# Patient Record
Sex: Male | Born: 1960 | Race: Asian | Hispanic: No | Marital: Married | Smoking: Never smoker
Health system: Southern US, Community
[De-identification: ages and names within clinical notes are randomized; demographics above are authoritative.]

## PROBLEM LIST (undated history)

## (undated) DIAGNOSIS — I10 Essential (primary) hypertension: Secondary | ICD-10-CM

## (undated) DIAGNOSIS — E785 Hyperlipidemia, unspecified: Secondary | ICD-10-CM

## (undated) DIAGNOSIS — T7840XA Allergy, unspecified, initial encounter: Secondary | ICD-10-CM

## (undated) DIAGNOSIS — G709 Myoneural disorder, unspecified: Secondary | ICD-10-CM

## (undated) HISTORY — DX: Allergy, unspecified, initial encounter: T78.40XA

## (undated) HISTORY — DX: Myoneural disorder, unspecified: G70.9

## (undated) HISTORY — DX: Hyperlipidemia, unspecified: E78.5

---

## 2013-01-13 ENCOUNTER — Ambulatory Visit (INDEPENDENT_AMBULATORY_CARE_PROVIDER_SITE_OTHER): Payer: BC Managed Care – PPO | Admitting: Family Medicine

## 2013-01-13 ENCOUNTER — Ambulatory Visit: Payer: BC Managed Care – PPO

## 2013-01-13 VITALS — BP 121/80 | HR 64 | Temp 98.1°F | Resp 16 | Ht 66.5 in | Wt 160.0 lb

## 2013-01-13 DIAGNOSIS — M79605 Pain in left leg: Secondary | ICD-10-CM

## 2013-01-13 DIAGNOSIS — Z609 Problem related to social environment, unspecified: Secondary | ICD-10-CM

## 2013-01-13 DIAGNOSIS — R059 Cough, unspecified: Secondary | ICD-10-CM

## 2013-01-13 DIAGNOSIS — Z789 Other specified health status: Secondary | ICD-10-CM

## 2013-01-13 DIAGNOSIS — M79604 Pain in right leg: Secondary | ICD-10-CM

## 2013-01-13 DIAGNOSIS — M79609 Pain in unspecified limb: Secondary | ICD-10-CM

## 2013-01-13 DIAGNOSIS — R05 Cough: Secondary | ICD-10-CM

## 2013-01-13 LAB — COMPREHENSIVE METABOLIC PANEL
ALT: 21 U/L (ref 0–53)
AST: 22 U/L (ref 0–37)
Alkaline Phosphatase: 64 U/L (ref 39–117)
BUN: 13 mg/dL (ref 6–23)
Calcium: 9.6 mg/dL (ref 8.4–10.5)
Creat: 1.08 mg/dL (ref 0.50–1.35)
Total Bilirubin: 0.4 mg/dL (ref 0.3–1.2)

## 2013-01-13 MED ORDER — FLUTICASONE PROPIONATE 50 MCG/ACT NA SUSP: 2.0000 | Freq: Every day | NASAL | Status: DC

## 2013-01-13 MED ORDER — MELOXICAM 7.5 MG PO TABS: 7.5000 mg | ORAL_TABLET | Freq: Every day | ORAL | Status: DC

## 2013-01-13 MED ORDER — BENZONATATE 100 MG PO CAPS: ORAL_CAPSULE | ORAL | Status: AC

## 2013-01-13 NOTE — Progress Notes (Signed)
[address], [place] [address]   Phone [phone]  Subjective:    Patient ID: Nicolas Thompson, male    DOB: June 08, 1961, 52 y.o.   MRN: [id number]  HPI Pt presents to clinic with 2 problems and daughter interpreting. 1- cough for the last 3-4 days with some throat and eye itching.  No congestion with rhinorrhea or productive cough.  He has been in the [place] from [place] for years and he has never had this problem in the past.   The cough is waking him up at night at times. 2- bilaterally leg pain that starts in his buttocks and goes through his calves.  I cannot determine whether the pain is sharp and shooting or aching.  He states it does not come from his back.  It is worse with bending and walking.  Once he stops walking the pain resolves.  The pain is the same in both legs.  He has no paresthesias or numbness. He has not had an injury to his back or legs that he knows of.  He works [employer] inside.  He last traveled to [place] 2 years ago.   OTC meds - benadryl and Nyquil not helping the cough, not taking any medications for the leg pain  Review of Systems  Constitutional: Negative for fever and chills.  HENT: Positive for sore throat (itching). Negative for congestion and rhinorrhea.   Respiratory: Positive for cough (dry cough).   Musculoskeletal: Positive for myalgias (back of legs - increases with activity). Negative for back pain, arthralgias and gait problem.  Allergic/Immunologic: Negative for environmental allergies.  Psychiatric/Behavioral: Positive for sleep disturbance (2nd to cough).       Objective:   Physical Exam  Vitals reviewed. Constitutional: He is oriented to person, place, and time. He appears well-developed and well-nourished.  Pt never makes eye contact or looks at me during my time in the room.  HENT:  Head: Normocephalic and atraumatic.  Right Ear: Hearing, tympanic membrane, external ear and ear canal normal.  Left Ear: Hearing, tympanic membrane,  external ear and ear canal normal.  Nose: Mucosal edema (pale and swollen) present. No rhinorrhea.  Mouth/Throat: Uvula is midline, oropharynx is clear and moist and mucous membranes are normal.  Eyes: Conjunctivae are normal.  Cardiovascular: Normal rate, regular rhythm and normal heart sounds.   No murmur heard. Pulmonary/Chest: Effort normal and breath sounds normal.  Musculoskeletal: Normal range of motion.       Lumbar back: Normal.       Right upper leg: Normal.       Left upper leg: Normal.       Right lower leg: Normal.       Left lower leg: Normal.  No TTP of posterior legs.  No palpable cords in the calves.  No erythema.  Cap refill of bilateral feet is 3-4 secs.  Feet are cold, DP pulses are good and equal bilaterally.  Pt has neg SLR.  He has no pain with resistance testing of leg muscle groups.  Neurological: He is alert and oriented to person, place, and time. He has normal strength and normal reflexes. He displays no atrophy. No cranial nerve deficit or sensory deficit. He exhibits normal muscle tone.  Reflex Scores:      Patellar reflexes are 2+ on the right side and 2+ on the left side.      Achilles reflexes are 2+ on the right side and 2+ on the left side. Skin: Skin is warm and  dry.   [facility] reading (PRIMARY) by  Dr. Katrinka Blazing.  Normal L spine.  Significant stool.     Assessment & Plan:  Leg pain, bilateral - Plan: DG Lumbar Spine 2-3 Views, Comprehensive metabolic panel, meloxicam (MOBIC) 7.5 MG tablet - unsure exactly what the patient is experiencing.  I believe this is muscle related and maybe from tight muscles and standing all day and aching as a result of a hard floor.  His xrays look normal and he has a normal NMV exam but spinal stenosis is still part of the differentiate.  ? Possible claudication due to the fact that the pain is worse with movement/walking and resolves with rest.  We will try a NSAID and if no better have pt RTC for recheck and refer if needed at that  time.  Cough - Plan: fluticasone (FLONASE) 50 MCG/ACT nasal spray, benzonatate (TESSALON) 100 MG capsule - I think that the patient's cough is related to allergies - we will treat the allergies and cough at the same time.  Language barrier - history was very difficult to get - daughter had a hard time explaining what he was experiencing.    Benny Lennert PA-C 01/13/2013 5:18 PM

## 2013-01-14 ENCOUNTER — Encounter: Payer: Self-pay | Admitting: *Deleted

## 2013-01-16 NOTE — Progress Notes (Signed)
Reviewed history and physical exam in detail with Benny Lennert, PA-C.  Agree with assessment and plan.

## 2015-01-14 ENCOUNTER — Ambulatory Visit (INDEPENDENT_AMBULATORY_CARE_PROVIDER_SITE_OTHER): Payer: 59

## 2015-01-14 ENCOUNTER — Ambulatory Visit (INDEPENDENT_AMBULATORY_CARE_PROVIDER_SITE_OTHER): Payer: 59 | Admitting: Emergency Medicine

## 2015-01-14 VITALS — BP 130/80 | HR 66 | Temp 98.0°F | Ht 66.5 in | Wt 164.1 lb

## 2015-01-14 DIAGNOSIS — J40 Bronchitis, not specified as acute or chronic: Secondary | ICD-10-CM | POA: Diagnosis not present

## 2015-01-14 DIAGNOSIS — J302 Other seasonal allergic rhinitis: Secondary | ICD-10-CM

## 2015-01-14 DIAGNOSIS — J209 Acute bronchitis, unspecified: Secondary | ICD-10-CM

## 2015-01-14 DIAGNOSIS — R05 Cough: Secondary | ICD-10-CM | POA: Diagnosis not present

## 2015-01-14 DIAGNOSIS — R059 Cough, unspecified: Secondary | ICD-10-CM

## 2015-01-14 MED ORDER — LEVOFLOXACIN 500 MG PO TABS: 500.0000 mg | ORAL_TABLET | Freq: Every day | ORAL | Status: AC

## 2015-01-14 MED ORDER — ALBUTEROL SULFATE HFA 108 (90 BASE) MCG/ACT IN AERS: 2.0000 | INHALATION_SPRAY | RESPIRATORY_TRACT | Status: DC | PRN

## 2015-01-14 MED ORDER — FEXOFENADINE-PSEUDOEPHED ER 180-240 MG PO TB24: 1.0000 | ORAL_TABLET | Freq: Every day | ORAL | Status: DC

## 2015-01-14 MED ORDER — HYDROCOD POLST-CPM POLST ER 10-8 MG/5ML PO SUER: 5.0000 mL | Freq: Two times a day (BID) | ORAL | Status: DC

## 2015-01-14 NOTE — Progress Notes (Signed)
[facility] [address], [place] [id number] [phone]  Date:  01/14/2015   Name:  Nicolas SextonCong Febres   DOB:  08/09/1961   MRN:  [id number]  PCP:  No PCP Per Patient    Chief Complaint: Allergic Rhinitis  and Cough   History of Present Illness:  Nicolas SextonCong Meder is a 54 y.o. very pleasant male patient who presents with the following:  Returned from [place] a month ago and has allergic symptoms since Has a non productive cough with no wheezing or shortness of breath Has itchy watery eyes, swollen lids. Has watery nasal drainage. Was put on antibiotic ()and given a steroid shot with no improvement at another office. Has a fever no chills Cough for two weeks. No nausea or vomiting. No stool change No rash No improvement with over the counter medications or other home remedies.  Denies other complaint or health concern today.   There are no active problems to display for this patient.   Past Medical History  Diagnosis Date  . Neuromuscular disorder     No past surgical history on file.  History  Substance Use Topics  . Smoking status: Never Smoker   . Smokeless tobacco: Not on file  . Alcohol Use: No    No family history on file.  No Known Allergies  Medication list has been reviewed and updated.  No current outpatient prescriptions on file prior to visit.   No current facility-administered medications on file prior to visit.    Review of Systems:  Review of Systems  Constitutional: Negative for fever, chills and fatigue.  HENT: Negative for congestion, ear pain, hearing loss, postnasal drip, rhinorrhea and sinus pressure.   Eyes: Negative for discharge and redness.  Respiratory: Negative for shortness of breath and wheezing.   Cardiovascular: Negative for chest pain and leg swelling.  Gastrointestinal: Negative for nausea, vomiting, abdominal pain, constipation and blood in stool.  Genitourinary: Negative for dysuria, urgency and frequency.   Musculoskeletal: Negative for neck stiffness.  Skin: Negative for rash.  Neurological: Negative for seizures, weakness and headaches.     Physical Examination: Filed Vitals:   01/14/15 1617  BP: 130/80  Pulse: 66  Temp: 98 F (36.7 C)   Filed Vitals:   01/14/15 1617  Height: 5' 6.5" (1.689 m)  Weight: 164 lb 2 oz (74.447 kg)   Body mass index is 26.1 kg/(m^2). Ideal Body Weight: Weight in (lb) to have BMI = 25: 156.9  GEN: WDWN, NAD, Non-toxic, A & O x 3  Persistent cough HEENT: Atraumatic, Normocephalic. Neck supple. No masses, No LAD. Ears and Nose: No external deformity. CV: RRR, No M/G/R. No JVD. No thrill. No extra heart sounds. PULM: CTA B, no wheezes, crackles, rhonchi. No retractions. No resp. distress. No accessory muscle use. ABD: S, NT, ND, +BS. No rebound. No HSM. EXTR: No c/c/e NEURO Normal gait.  PSYCH: Normally interactive. Conversant. Not depressed or anxious appearing.  Calm demeanor.    Assessment and Plan: Bronchitis bronchospasm Albuterol SAR Allegra d   Signed Phillips OdorJeffery Jubal Rademaker, MD   [facility] reading (PRIMARY) by  Dr. Dareen PianoAnderson. Negative.

## 2015-01-14 NOTE — Patient Instructions (Signed)
Metered Dose Inhaler (No Spacer Used)  Inhaled medicines are the basis of treatment for asthma and other breathing problems. Inhaled medicine can only be effective if used properly. Good technique assures that the medicine reaches the lungs.  Metered dose inhalers (MDIs) are used to deliver a variety of inhaled medicines. These include quick relief or rescue medicines (such as bronchodilators) and controller medicines (such as corticosteroids). The medicine is delivered by pushing down on a metal canister to release a set amount of spray.  If you are using different kinds of inhalers, use your quick relief medicine to open the airways 10-15 minutes before using a steroid, if instructed to do so by your health care provider. If you are unsure which inhalers to use and the order of using them, ask your health care provider, nurse, or respiratory therapist.  HOW TO USE THE INHALER  1. Remove the cap from the inhaler.  2. If you are using the inhaler for the first time, you will need to prime it. Shake the inhaler for 5 seconds and release four puffs into the air, away from your face. Ask your health care provider or pharmacist if you have questions about priming your inhaler.  3. Shake the inhaler for 5 seconds before each breath in (inhalation).  4. Position the inhaler so that the top of the canister faces up.  5. Put your index finger on the top of the medicine canister. Your thumb supports the bottom of the inhaler.  6. Open your mouth.  7. Either place the inhaler between your teeth and place your lips tightly around the mouthpiece, or hold the inhaler 1-2 inches away from your open mouth. If you are unsure of which technique to use, ask your health care provider.  8. Breathe out (exhale) normally and as completely as possible.  9. Press the canister down with the index finger to release the medicine.  10. At the same time as the canister is pressed, inhale deeply and slowly until your lungs are completely filled.  This should take 4-6 seconds. Keep your tongue down.  11. Hold the medicine in your lungs for 5-10 seconds (10 seconds is best). This helps the medicine get into the small airways of your lungs.  12. Breathe out slowly, through pursed lips. Whistling is an example of pursed lips.  13. Wait at least 1 minute between puffs. Continue with the above steps until you have taken the number of puffs your health care provider has ordered. Do not use the inhaler more than your health care provider directs you to.  14. Replace the cap on the inhaler.  15. Follow the directions from your health care provider or the inhaler insert for cleaning the inhaler.  If you are using a steroid inhaler, after your last puff, rinse your mouth with water, gargle, and spit out the water. Do not swallow the water.  AVOID:  · Inhaling before or after starting the spray of medicine. It takes practice to coordinate your breathing with triggering the spray.  · Inhaling through the nose (rather than the mouth) when triggering the spray.  HOW TO DETERMINE IF YOUR INHALER IS FULL OR NEARLY EMPTY  You cannot know when an inhaler is empty by shaking it. Some inhalers are now being made with dose counters. Ask your health care provider for a prescription that has a dose counter if you feel you need that extra help. If your inhaler does not have a counter, ask your health care   provider to help you determine the date you need to refill your inhaler. Write the refill date on a calendar or your inhaler canister. Refill your inhaler 7-10 days before it runs out. Be sure to keep an adequate supply of medicine. This includes making sure it has not expired, and making sure you have a spare inhaler.  SEEK MEDICAL CARE IF:  · Symptoms are only partially relieved with your inhaler.  · You are having trouble using your inhaler.  · You experience an increase in phlegm.  SEEK IMMEDIATE MEDICAL CARE IF:  · You feel little or no relief with your inhalers. You are still  wheezing and feeling shortness of breath, tightness in your chest, or both.  · You have dizziness, headaches, or a fast heart rate.  · You have chills, fever, or night sweats.  · There is a noticeable increase in phlegm production, or there is blood in the phlegm.  MAKE SURE YOU:  · Understand these instructions.  · Will watch your condition.  · Will get help right away if you are not doing well or get worse.  Document Released: 06/29/2007 Document Revised: 01/16/2014 Document Reviewed: 02/17/2013  [employer]® Patient Information ©2015 [employer]. This information is not intended to replace advice given to you by your health care provider. Make sure you discuss any questions you have with your health care provider.

## 2015-02-07 ENCOUNTER — Ambulatory Visit (INDEPENDENT_AMBULATORY_CARE_PROVIDER_SITE_OTHER): Payer: 59

## 2015-02-07 ENCOUNTER — Ambulatory Visit (INDEPENDENT_AMBULATORY_CARE_PROVIDER_SITE_OTHER): Payer: 59 | Admitting: Family Medicine

## 2015-02-07 VITALS — BP 122/80 | HR 66 | Temp 98.8°F | Resp 18 | Ht 66.5 in | Wt 162.6 lb

## 2015-02-07 DIAGNOSIS — R1013 Epigastric pain: Secondary | ICD-10-CM

## 2015-02-07 DIAGNOSIS — R05 Cough: Secondary | ICD-10-CM

## 2015-02-07 DIAGNOSIS — K219 Gastro-esophageal reflux disease without esophagitis: Secondary | ICD-10-CM

## 2015-02-07 DIAGNOSIS — E860 Dehydration: Secondary | ICD-10-CM

## 2015-02-07 DIAGNOSIS — R059 Cough, unspecified: Secondary | ICD-10-CM

## 2015-02-07 DIAGNOSIS — R630 Anorexia: Secondary | ICD-10-CM

## 2015-02-07 DIAGNOSIS — J302 Other seasonal allergic rhinitis: Secondary | ICD-10-CM

## 2015-02-07 LAB — POCT CBC
Granulocyte percent: 60.5 % (ref 37–80)
HCT, POC: 47.1 % (ref 43.5–53.7)
Hemoglobin: 16.1 g/dL (ref 14.1–18.1)
Lymph, poc: 2.5 (ref 0.6–3.4)
MCH, POC: 30.4 pg (ref 27–31.2)
MCHC: 34.2 g/dL (ref 31.8–35.4)
MCV: 88.9 fL (ref 80–97)
MID (cbc): 0.4 (ref 0–0.9)
MPV: 7.5 fL (ref 0–99.8)
POC Granulocyte: 4.4 (ref 2–6.9)
POC LYMPH PERCENT: 34.3 %L (ref 10–50)
POC MID %: 5.2 %M (ref 0–12)
Platelet Count, POC: 215 10*3/uL (ref 142–424)
RBC: 5.3 M/uL (ref 4.69–6.13)
RDW, POC: 13 %
WBC: 7.2 10*3/uL (ref 4.6–10.2)

## 2015-02-07 NOTE — Patient Instructions (Signed)
Nasocort nasal spray over the counter Zyrtec daily  Colace stool softener ( will make your stomach ache)  Need to increase water, avoid the following Caffeine, citrus, vinegar, tomato based products, spicy foods, greasy foods, , alcohol You can try prilosec 20 mg daily for GERD symtpoms    To bn (Constipation) To bn l khi m?t ng??i ?i ??i ti?n t h?n ba l?n trong m?t tu?n, ??i ti?n kh kh?n, ho?c ??i ti?n ra phn kh, c?ng, ho?c to h?n bnh th??ng. Khi tu?i cng cao th cng d? b? to bn. N?u qu v? c? g?ng ch?a to bn b?ng cc lo?i thu?c gip qu v? ??i ti?n ???c (thu?c nhu?n trng), b?nh c th? n?ng thm. S? d?ng thu?c nhu?n trng trong th?i gian di c th? lm cho cc c? c?a ru?t gi y?u ?i. Ch? ?? ?n thi?u ch?t x?, khng u?ng ?? n??c, v dng m?t s? lo?i thu?c nh?t ??nh c th? lm to bo n?ng thm.  NGUYN NHN.   M?t s? lo?i thu?c nh?t ??nh nh? thu?c ch?ng tr?m c?m, thu?c gi?m ?au, thu?c c b? sung s?t, thu?c trung ha axit d?ch v?, v thu?c l?i ti?u.  M?t s? b?nh l nh?t ??nh nh? ti?u ???ng, h?i ch?ng ru?t kch thch (IBS), b?nh c?a tuy?n gip, ho?c tr?m c?m.  Khng u?ng ?? n??c.  Khng ?n ?? th?c ?n giu ch?t x?.  C?ng th?ng ho?c do ?i l?i.  t ho?t ??ng thn th? ho?c th? d?c.  Nh?n ?i ??i ti?n.  S? d?ng qu nhi?u thu?c nhu?n trng. D?U HI?U V TRI?U CH?NG   ?i ??i ti?n t h?n ba l?n m?i tu?n.  Ph?i r?n m?nh ?? ??i ti?n.  ??i ti?n ra phn c?ng, kh, ho?c to h?n bnh th??ng.  C?m th?y ??y b?ng ho?c ch??ng b?ng.  ?au ? vng b?ng d??i.  Khng c?m th?y tho?i mi sau khi ??i ti?n. CH?N ?ON  Chuyn gia ch?m [place] s?c kh?e c?a qu v? s? h?i v? b?nh s? v khm th?c th? cho qu v?. C th? c?n ki?m tra thm trong tr??ng h?p to bn n?ng. M?t s? ki?m tra c th? bao g?m:  Ch?p X quang c ch?t c?n quang ?? ki?m tra tr?c trng, ??i trng, v ?i khi c? ru?t non c?a qu v?.  N?i soi tr?c trng sigma ?? ki?m tra ph?n pha d??i c?a ??i trng.  Th? thu?t soi ??i trng ??  khm ton b? ??i trng. ?I?U TR?  ?i?u tr? ty thu?c vo m?c ?? tr?m tr?ng c?a ch?ng to bn v nguyn nhn gy to bn. ?i?u tr? thng qua ch? ?? ?n u?ng bao g?m u?ng nhi?u n??c h?n v ?n th?c ?n c nhi?u ch?t x? h?n. ?i?u tr? thng qua l?i s?ng c th? bao g?m vi?c t?p th? d?c th??ng xuyn. N?u nh?ng khuy?n ngh? v? ch? ?? ?n v l?i s?ng khng c tc d?ng, chuyn gia ch?m [place] s?c kh?e c th? khuy?n ngh? qu v? dng cc lo?i thu?c nhu?n trng khng c?n k ??n ?? gip qu v? ??i ti?n. C th? ph?i k ??n thu?c c?n k ??n n?u thu?c khng c?n k ??n khng c tc d?ng.  H??NG D?N CH?M [place] T?I NH   ?n th?c ?n c nhi?u ch?t x? nh? tri cy, rau, ng? c?c nguyn h?t, v cc lo?i ??u.  H?n ch? th?c ?n c nhi?u ch?t bo v ???ng ch? bi?n s?n, ch?ng h?n khoai ty chin, bnh hamburger, bnh quy, k?o, v soda.  C th? dng th?c ph?m ch?c n?ng c b? sung ch?t x? vo kh?u ph?n ?n c?a qu v? n?u qu v? khng th? dng ?? ch?t x? t? th?c ?n.  U?ng ?? n??c ?? gi? cho n??c ti?u trong ho?c vng nh?t.  T?p th? d?c th??ng xuyn ho?c theo ch? d?n c?a chuyn gia ch?m [place] s?c kh?e.  Vo nh v? sinh ngay khi qu v? c nhu c?u. Khng nh?n ?i ??i ti?n.  Ch? s? d?ng thu?c khng c?n k ??n ho?c thu?c c?n k ??n theo ch? d?n c?a chuyn gia ch?m [place] s?c kh?e.Khng dng cc lo?i thu?c ch?ng to bn khc m khng bn b?c tr??c v?i chuyn gia ch?m [place] s?c kh?e. NGAY L?P T?C ?I KHM N?U:   ?i ??i ti?n ra mu ?? t??i.  Ch?ng to bn ko di h?n 4 ngy v tr?m tr?ng h?n.  Qu v? b? ?au b?ng ho?c ?au tr?c trng.  Phn c?a qu v? m?ng, trng nh? bt ch.  Qu v? b? s?t cn khng r nguyn nhn. ??M B?O QU V?:   Hi?u r cc h??ng d?n ny.  S? theo di tnh tr?ng c?a mnh.  S? yu c?u tr? gip ngay l?p t?c n?u qu v? c?m th?y khng kh?e ho?c th?y tr?m tr?ng h?n. Document Released: 12/17/2010 Document Revised: 09/06/2013 [facility] Patient Information 2015 [place]. This information is not intended to replace advice  given to you by your health care provider. Make sure you discuss any questions you have with your health care provider. B?nh Tro Ng??c D? Dy Th?c Qu?n, Ng??i L?n (Gastroesophageal Reflux Diseaes, Adult) B?nh tro ng??c d? dy th?c qu?n (GERD) x?y ra khi axit t? d? dy tro ln th?c qu?n. Khi axit ti?p xc v?i th?c qu?n, axit gy ra ?au (vim) trong th?c qu?n. Theo th?i gian, GERD c th? t?o ra cc l? nh? (cc v?t lot) ? nim m?c th?c qu?n.  NGUYN NHN  Tr?ng l??ng c? th? t?ng. ?i?u ny t?o p l?c ln d? dy, lm t?ng axit t? d? dy vo th?c qu?n.  Ht thu?c l. Ht thu?c l lm t?ng s?n sinh axit trong d? dy.  U?ng r??u. ?y l nguyn nhn lm gi?m p l?c trong c? th?t th?c qu?n d??i (van ho?c vng c? gi?a th?c qu?n v d? dy), cho php axit t? d? dy vo th?c qu?n.  ?n t?i mu?n v b?ng no. Tnh tr?ng ny lm t?ng p l?c c?ng nh? t?ng s?n sinh axit trong d? dy.  D? t?t c? th?t th?c qu?n d??i. ?i khi khng tm th?y nguyn nhn. TRI?U CH?NG  ?au rt ? ph?n d??i gi?a ng?c pha sau x??ng ?c v ? khu v?c gi?a d? dy. Hi?n t??ng ny c th? x?y ra hai l?n m?t tu?n ho?c th??ng xuyn h?n.  Kh nu?t.  ?au h?ng.  Ho khan.  Cc tri?u ch?ng gi?ng hen suy?n, bao g?m t?c ng?c,kh th? ho?c th? kh kh. CH?N ?ON Chuyn gia ch?m [place] s?c kh?e c th? ch?n ?on GERD d?a trn cc tri?u ch?ng c?a b?n. Trong m?t s? tr??ng h?p, ch?p X quang v cc xt nghi?m khc c th? ???c ti?n hnh ?? ki?m tra cc bi?n ch?ng ho?c tnh tr?ng c?a d? dy v th?c qu?n. ?I?U TR? Chuyn gia ch?m [place] s?c kh?e c th? khuy?n ngh? dng thu?c khng c?n k ??n ho?c thu?c c?n k ??n ?? gip gi?m s?n sinh axit. Hy h?i chuyn gia ch?m [place] s?c kh?e c?a b?n tr??c khi b?t ??  u ho?c dng thm b?t k? lo?i thu?c m?i no. H??NG D?N CH?M [place] T?I NH  Thay ??i cc y?u t? m b?n c th? ki?m sot ???c. H?i chuyn gia ch?m [place] s?c kh?e ?? ???c h??ng d?n v? vi?c gi?m cn, b? thu?c l v s? d?ng r??u.  Hessie Diener cc lo?i th?c ph?m v ?? u?ng  lm cho cc tri?u ch?ng t?i t? h?n, ch?ng h?n nh?:  ?? u?ng c caffeine ho?c r??u.  S c la.  B?c h ho?c v? b?c h.  T?i v hnh ty.  Th?c ?n [place].  Tri cy h? cam, ch?ng h?n nh? cam, chanh hay chanh ty.  Cc th?c ?n c c chua, ch?ng h?n nh? n??c x?t, ?t, salsa (n??c x?t [place]) v bnh pizza.  Cc lo?i th?c ?n chin xo v nhi?u ch?t bo.  Trnh n?m xu?ng ng? 3 ti?ng tr??c gi? ?i ng? ho?c tr??c khi c m?t gi?c ng? ng?n.  ?n nh?ng b?a ?n nh?, th??ng xuyn h?n thay v cc b?a ?n l?n.  M?c qu?n o r?ng. Khng ?eo b?t c? th? g ch?t quanh th?t l?ng gy p l?c ln d? dy.  Nng ??u gi??ng cao ln t? 6 ??n 8 inch b?ng cc kh?i g? ?? gip b?n ng?. S? d?ng thm g?i s? khng c tc d?ng.  Ch? s? d?ng thu?c khng c?n k ??n ho?c thu?c c?n k ??n ?? gi?m ?au, gi?m c?m gic kh ch?u ho?c h? s?t theo ch? d?n c?a chuyn gia ch?m [place] s?c kh?e c?a b?n.  Khng dng thu?c atpirin, ibuprofen ho?c cc thu?c ch?ng vim khng c steroid (NSAID) khc. HY NGAY L?P T?C ?I KHM N?U:  B?n b? ?au ? cnh tay, c?, hm, r?ng ho?c l?ng.  Hi?n t??ng ?au t?ng ln ho?c thay ??i theo c??ng ?? ho?c th?i gian.  B?n b? bu?n nn, nn ho?c ?? m? hi (tot m? hi).  B?n b? kh th? ho?c ng?t x?u.  Ch?t nn c mu xanh l cy, vng, ?en ho?c trng gi?ng nh? b c ph ho?c mu.  Phn c mu ??, ?? nh? mu ho?c ?en. Nh?ng tri?u ch?ng ny c th? l d?u hi?u c?a cc v?n ?? khc, ch?ng h?n nh? b?nh tim, ch?y mu d? dy ho?c ch?y mu th?c qu?n. ??M B?O B?N:  Hi?u cc h??ng d?n ny.  S? theo di tnh tr?ng c?a mnh.  S? yu c?u tr? gip ngay l?p t?c n?u b?n c?m th?y khng ?? ho?c tnh tr?ng tr?m tr?ng h?n. Document Released: 06/11/2005 Document Revised: 05/04/2013 [facility] Patient Information 2015 [place]. This information is not intended to replace advice given to you by your health care provider. Make sure you discuss any questions you have with your health care provider. Allergies Allergies may  happen from anything your body is sensitive to. This may be food, medicines, pollens, chemicals, and nearly anything around you in everyday life that produces allergens. An allergen is anything that causes an allergy producing substance. Heredity is often a factor in causing these problems. This means you may have some of the same allergies as your parents. Food allergies happen in all age groups. Food allergies are some of the most severe and life threatening. Some common food allergies are cow's milk, seafood, eggs, nuts, wheat, and soybeans. SYMPTOMS   Swelling around the mouth.  An itchy red rash or hives.  Vomiting or diarrhea.  Difficulty breathing. SEVERE ALLERGIC REACTIONS ARE LIFE-THREATENING. This reaction is called anaphylaxis. It can cause the mouth and  throat to swell and cause difficulty with breathing and swallowing. In severe reactions only a trace amount of food (for example, peanut oil in a salad) may cause death within seconds. Seasonal allergies occur in all age groups. These are seasonal because they usually occur during the same season every year. They may be a reaction to molds, grass pollens, or tree pollens. Other causes of problems are house dust mite allergens, pet dander, and mold spores. The symptoms often consist of nasal congestion, a runny itchy nose associated with sneezing, and tearing itchy eyes. There is often an associated itching of the mouth and ears. The problems happen when you come in contact with pollens and other allergens. Allergens are the particles in the air that the body reacts to with an allergic reaction. This causes you to release allergic antibodies. Through a chain of events, these eventually cause you to release histamine into the blood stream. Although it is meant to be protective to the body, it is this release that causes your discomfort. This is why you were given anti-histamines to feel better. If you are unable to pinpoint the offending  allergen, it may be determined by skin or blood testing. Allergies cannot be cured but can be controlled with medicine. Hay fever is a collection of all or some of the seasonal allergy problems. It may often be treated with simple over-the-counter medicine such as diphenhydramine. Take medicine as directed. Do not drink alcohol or drive while taking this medicine. Check with your caregiver or package insert for child dosages. If these medicines are not effective, there are many new medicines your caregiver can prescribe. Stronger medicine such as nasal spray, eye drops, and corticosteroids may be used if the first things you try do not work well. Other treatments such as immunotherapy or desensitizing injections can be used if all else fails. Follow up with your caregiver if problems continue. These seasonal allergies are usually not life threatening. They are generally more of a nuisance that can often be handled using medicine. HOME CARE INSTRUCTIONS   If unsure what causes a reaction, keep a diary of foods eaten and symptoms that follow. Avoid foods that cause reactions.  If hives or rash are present:  Take medicine as directed.  You may use an over-the-counter antihistamine (diphenhydramine) for hives and itching as needed.  Apply cold compresses (cloths) to the skin or take baths in cool water. Avoid hot baths or showers. Heat will make a rash and itching worse.  If you are severely allergic:  Following a treatment for a severe reaction, hospitalization is often required for closer follow-up.  Wear a medic-alert bracelet or necklace stating the allergy.  You and your family must learn how to give adrenaline or use an anaphylaxis kit.  If you have had a severe reaction, always carry your anaphylaxis kit or EpiPen with you. Use this medicine as directed by your caregiver if a severe reaction is occurring. Failure to do so could have a fatal outcome. SEEK MEDICAL CARE IF:  You suspect a  food allergy. Symptoms generally happen within 30 minutes of eating a food.  Your symptoms have not gone away within 2 days or are getting worse.  You develop new symptoms.  You want to retest yourself or your child with a food or drink you think causes an allergic reaction. Never do this if an anaphylactic reaction to that food or drink has happened before. Only do this under the care of a caregiver. SEEK IMMEDIATE  MEDICAL CARE IF:   You have difficulty breathing, are wheezing, or have a tight feeling in your chest or throat.  You have a swollen mouth, or you have hives, swelling, or itching all over your body.  You have had a severe reaction that has responded to your anaphylaxis kit or an EpiPen. These reactions may return when the medicine has worn off. These reactions should be considered life threatening. MAKE SURE YOU:   Understand these instructions.  Will watch your condition.  Will get help right away if you are not doing well or get worse. Document Released: 11/25/2002 Document Revised: 12/27/2012 Document Reviewed: 05/01/2008 [facility] Patient Information 2015 [place], [place]. This information is not intended to replace advice given to you by your health care provider. Make sure you discuss any questions you have with your health care provider.

## 2015-02-07 NOTE — Progress Notes (Signed)
 Chief Complaint:  Chief Complaint  Patient presents with  . Follow-up    cough,fever,nasal congestion  . Eye Problem    right eye discoloration,redness  . Abdominal Pain    HPI: Nicolas Thompson is a 54 y.o. male who is here for one-month history of cough, nasal congestion, mid epigastric abdominal pain since returning from [place] His had poor by mouth intake. He feels nauseated with or without food, he has been eating rice soup. No vomitus. Did have some subjective fevers and chills. No flank pain or back pain. No urinary symptoms. Cough has gone better but he still has a persistent cough that makes him have throat pain. He recently traveled to [place] about 2 months ago. He denies any history of tuberculosis. Last chest x-ray was negative for any acute cardiopulmonary process.   He has taken over-the-counter nasal spray, Allegra without any relief. Yesterday he had some conjunctival bleeding on the right eye. He has had no rashes. He has had no night sweats. He admits to some minimal acid refulx sxs, he doe snot smoek or drink etoh  Past Medical History  Diagnosis Date  . Neuromuscular disorder    History reviewed. No pertinent past surgical history. History   Social History  . Marital Status: Married    Spouse Name: N/A  . Number of Children: N/A  . Years of Education: N/A   Social History Main Topics  . Smoking status: Never Smoker   . Smokeless tobacco: Not on file  . Alcohol Use: No  . Drug Use: No  . Sexual Activity: Yes   Other Topics Concern  . None   Social History Narrative   History reviewed. No pertinent family history. No Known Allergies Prior to Admission medications   Medication Sig Start Date End Date Taking? Authorizing Provider  albuterol (PROVENTIL HFA;VENTOLIN HFA) 108 (90 BASE) MCG/ACT inhaler Inhale 2 puffs into the lungs every 4 (four) hours as needed for wheezing or shortness of breath (cough, shortness of breath or wheezing.). 01/14/15  Yes  Carmelina Dane, MD  chlorpheniramine-HYDROcodone [facility] PENNKINETIC ER) 10-8 MG/5ML SUER Take 5 mLs by mouth 2 (two) times daily. Patient not taking: Reported on 02/07/2015 01/14/15   Carmelina Dane, MD  fexofenadine-pseudoephedrine (ALLEGRA-D ALLERGY & CONGESTION) 180-240 MG per 24 hr tablet Take 1 tablet by mouth daily. Patient not taking: Reported on 02/07/2015 01/14/15   Carmelina Dane, MD     ROS: The patient denies night sweats, unintentional weight loss, chest pain, palpitations, wheezing, dyspnea on exertion,  vomiting, dysuria, hematuria, melena, numbness, weakness, or tingling.  All other systems have been reviewed and were otherwise negative with the exception of those mentioned in the HPI and as above.    PHYSICAL EXAM: Filed Vitals:   02/07/15 1910  BP: 122/80  Pulse: 66  Temp: 98.8 F (37.1 C)  Resp: 18   Filed Vitals:   02/07/15 1910  Height: 5' 6.5" (1.689 m)  Weight: 162 lb 9.6 oz (73.755 kg)   Body mass index is 25.85 kg/(m^2).  General: Alert, no acute distress HEENT:  Normocephalic, atraumatic, oropharynx patent. EOMI, PERRLA Boggy nares, he has a slight conjunctival erythema on right eye, TM nl. +/- sinus tenderness Cardiovascular:  Regular rate and rhythm, no rubs murmurs or gallops.  No Carotid bruits, radial pulse intact. No pedal edema.  Respiratory: Clear to auscultation bilaterally.  No wheezes, rales, or rhonchi.  No cyanosis, no use of accessory musculature GI: No organomegaly, abdomen  is soft and minimally -tender mid abd, positive bowel sounds.  No masses. Skin: No rashes. Neurologic: Facial musculature symmetric. 5/5 UE and   Psychiatric: Patient is appropriate throughout our interaction. Lymphatic: No cervical lymphadenopathy Musculoskeletal: Gait intact.   LABS: Results for orders placed or performed in visit on 02/07/15  POCT CBC  Result Value Ref Range   WBC 7.2 4.6 - 10.2 K/uL   Lymph, poc 2.5 0.6 - 3.4   POC LYMPH  PERCENT 34.3 10 - 50 %L   MID (cbc) 0.4 0 - 0.9   POC MID % 5.2 0 - 12 %M   POC Granulocyte 4.4 2 - 6.9   Granulocyte percent 60.5 37 - 80 %G   RBC 5.30 4.69 - 6.13 M/uL   Hemoglobin 16.1 14.1 - 18.1 g/dL   HCT, POC 29.547.1 62.143.5 - 53.7 %   MCV 88.9 80 - 97 fL   MCH, POC 30.4 27 - 31.2 pg   MCHC 34.2 31.8 - 35.4 g/dL   RDW, POC 30.813.0 %   Platelet Count, POC 215 142 - 424 K/uL   MPV 7.5 0 - 99.8 fL     EKG/XRAY:   [facility] No acute cardio pulm process + mod stool   ASSESSMENT/PLAN: Encounter Diagnoses  Name Primary?  . Epigastric pain Yes  . Dehydration   . Anorexia   . Cough   . Seasonal allergies   . Gastroesophageal reflux disease, esophagitis presence not specified    54 y/o [place] male with allergy/URI like sxs , s/p 1 week course of Levaquin, still feeling not quite himslef. Still has allergy sysmmptoms and gerdlike symtpoms.  Right eye conjunctiva due to allergies and possible made have popped a blood vessel due to coughing spell. I am not going to giv ecough meds to see if the narcotics maybe a culprit inhis nausea or if it is PND vs GERD Felt better IV Fluids Labs pending Cont with allergy meds: zyrtec am, allegra pm, flonase GERD precautions-avoid gerd inducing foods, gave info in vietnamese, take otc prilosec prn Constipation -increase water, miralax otc prn  OTC meds for allergies and GERD f/u prn   Gross sideeffects, risk and benefits, and alternatives of medications d/w patient. Patient is aware that all medications have potential sideeffects and we are unable to predict every sideeffect or drug-drug interaction that may occur.  Hamilton Capri,  PHUONG, DO 02/07/2015 9:06 PM

## 2015-02-07 NOTE — Progress Notes (Signed)
IV placed per Dr. Irwin BrakemanLe's verbal order. Deliah BostonMichael Clark, MS, PA-C   8:21 PM, 02/07/2015

## 2015-02-08 LAB — COMPLETE METABOLIC PANEL WITHOUT GFR
ALT: 33 U/L (ref 0–53)
AST: 33 U/L (ref 0–37)
Albumin: 4.7 g/dL (ref 3.5–5.2)
Alkaline Phosphatase: 61 U/L (ref 39–117)
BUN: 17 mg/dL (ref 6–23)
CO2: 21 meq/L (ref 19–32)
Calcium: 9.7 mg/dL (ref 8.4–10.5)
Chloride: 103 meq/L (ref 96–112)
Creat: 1.05 mg/dL (ref 0.50–1.35)
GFR, Est African American: >89 mL/min
GFR, Est Non African American: 80 mL/min
Glucose, Bld: 90 mg/dL (ref 70–99)
Potassium: 4 meq/L (ref 3.5–5.3)
Sodium: 140 meq/L (ref 135–145)
Total Bilirubin: 0.4 mg/dL (ref 0.2–1.2)
Total Protein: 8.2 g/dL (ref 6.0–8.3)

## 2015-02-08 LAB — LIPASE: Lipase: 50 U/L (ref 0–75)

## 2015-02-09 ENCOUNTER — Telehealth: Payer: Self-pay

## 2015-02-09 ENCOUNTER — Telehealth: Payer: Self-pay | Admitting: Family Medicine

## 2015-02-09 NOTE — Telephone Encounter (Signed)
Gave daughter message that all lab results were normal. She was appreciative.

## 2015-02-09 NOTE — Telephone Encounter (Signed)
PATIENT'S DAUGHTER (MIMI) STATES WE CALLED THIS MORNING TO TELL HER FATHER THAT ALL HIS LAB WORK CAME BACK NORMAL. SHE WOULD  LIKE TO GET MORE DETAILED INFORMATION ON HIS RESULTS LIKE HIS LIVER TEST ETC. BEST PHONE [phone] (DAUGHTER'S NAME  IS MIMI Chaviano)   PHARMACY CHOICE IS [facility] ON [address].  MBC

## 2015-02-09 NOTE — Telephone Encounter (Signed)
LM that labs are normal 

## 2015-02-19 ENCOUNTER — Ambulatory Visit (INDEPENDENT_AMBULATORY_CARE_PROVIDER_SITE_OTHER): Payer: 59 | Admitting: Internal Medicine

## 2015-02-19 VITALS — BP 118/78 | HR 72 | Temp 98.5°F | Resp 18 | Ht 66.5 in | Wt 162.0 lb

## 2015-02-19 DIAGNOSIS — L237 Allergic contact dermatitis due to plants, except food: Secondary | ICD-10-CM | POA: Diagnosis not present

## 2015-02-19 DIAGNOSIS — R05 Cough: Secondary | ICD-10-CM | POA: Diagnosis not present

## 2015-02-19 DIAGNOSIS — R059 Cough, unspecified: Secondary | ICD-10-CM

## 2015-02-19 DIAGNOSIS — K2100 Gastro-esophageal reflux disease with esophagitis, without bleeding: Secondary | ICD-10-CM

## 2015-02-19 MED ORDER — METHYLPREDNISOLONE ACETATE 80 MG/ML IJ SUSP
120.0000 mg | Freq: Once | INTRAMUSCULAR | Status: AC
  Administered 2015-02-19: 120 mg via INTRAMUSCULAR

## 2015-02-19 MED ORDER — OMEPRAZOLE 20 MG PO CPDR: 20.0000 mg | DELAYED_RELEASE_CAPSULE | Freq: Every day | ORAL | Status: DC

## 2015-02-19 MED ORDER — PREDNISONE 20 MG PO TABS: ORAL_TABLET | ORAL | Status: DC

## 2015-02-19 NOTE — Patient Instructions (Addendum)
Vim da do Cy S?n ??c (Poison Ivy) Vim da do s?n ??c l vim da (vim da ti?p xc) do ch?m vo ch?t gy d? ?ng trn l c?a cy s?n ??c sau khi ti?p xc v?i cy tr??c ?. Pht ban th??ng xu?t hi?n 48 gi? sau khi ti?p xc. Pht ban th??ng c d?ng u c?c (n?t s?n) ho?c m?n n??c theo d?ng v?t di. Ty thu?c vo m?c ?? nh?y c?m c?a chnh b?n, pht ban c th? ch? gy t?y ?? v ng?a, ho?c n c?ng c th? ti?n tri?n thnh m?n n??c c th? v? ra. Nh?ng t?n th??ng ny ph?i ???c ch?m [place] c?n th?n ?? ng?n ng?a nhi?m trng th? pht do vi khu?n (vi trng), sau ? s? ?? l?i s?o. Gi? m?i vng ?? da tr?n kh ro, s?ch s?, ???c b?ng b v ph? m?t l?p thu?c m? ch?ng vi khu?n, n?u c?n. M?t c?ng c th? b? s?ng hp. S? s?ng hp t?i t? nh?t vo bu?i sng v kh h?n vo cc giai ?o?n sau trong ngy. B?nh vim da ny th??ng s? lnh m khng ?? l?i s?o trong vng 2 ??n 3 tu?n khng c?n ?i?u tr?Marland Kitchen  H??NG D?N CH?M [place] T?I NH R?a s?ch b?ng x phng v n??c ngay sau khi ti?p xc v?i cy s?n ??c. B?n c kho?ng n?a ti?ng lo?i b? nh?a cy tr??c khi n gy pht ban. Vi?c r?a ny s? ph h?y d?u ho?c khng nguyn trn da gy ra ho?c s? gy ra pht ban. ??m b?o l r?a bn d??i mng tay v b?t k? cht nh?a cy no ? ? s? ti?p t?c lm pht ban lan r?ng. Khng c? da m?nh khi r?a nh?ng vng b? ?nh h??ng. Vim da s?n ??c khng lan ra nh?ng ch? khc n?u khng cn d?u c?a cy trn c? th?. Pht ban ? ti?n tri?n thnh v?t lot ch?y n??c s? khng lm cho pht ban lan ra nh?ng ch? khc tr? khi b?n khng r?a k?. ?i?u quan tr?ng l b?n ph?i gi?t s?ch m?i qu?n o b?n ? m?c v nh?ng qu?n o ny c th? mang nh?ng ch?t gy d? ?ng. Pht ban s? tr? l?i n?u b?n m?c qu?n o ch?a gi?t, k? c? sau vi ngy. [place] cy ny trong t??ng lai l bi?n php t?t nh?t. Cy s?n ??c c th? ???c nh?n d?ng b?ng s? l. Thng th??ng, cy s?n ??c c ba l v?i cc nhnh hoa trn m?t c?ng duy nh?t. Diphenhydramine c th? ???c mua m khng c?n k toa v ???c s? d?ng khi c?n ?? tr? ng?a.  Khng li xe khi s? d?ng thu?c ny n?u thu?c lm cho b?n bu?n ng?. Hy tham v?n v?i chuyn gia ch?m [place] s?c kh?e v? thu?c cho tr? em. HY ?I KHM N?U:  V?t lot h? pht tri?n.  V?t t?y ?? lan ra ngoi vng pht ban.  B?n nh?n th?y c m? (gi?ng m?) ch?y ra.  B?n b? ?au gia t?ng.  Cc d?u hi?u khc c?a tnh tr?ng nhi?m trng pht tri?n (ch?ng h?n nh? s?t). Document Released: 09/01/2005 Document Revised: 05/04/2013 [facility] Patient Information 2015 [place], [place]. This information is not intended to replace advice given to you by your health care provider. Make sure you discuss any questions you have with your health care provider. B?nh Tro Ng??c D? Dy Th?c Qu?n, Ng??i L?n (Gastroesophageal Reflux Diseaes, Adult) B?nh tro ng??c d? dy th?c qu?n (GERD) x?y ra khi axit t? d? dy tro ln th?c qu?n.  Khi axit ti?p xc v?i th?c qu?n, axit gy ra ?au (vim) trong th?c qu?n. Theo th?i gian, GERD c th? t?o ra cc l? nh? (cc v?t lot) ? nim m?c th?c qu?n.  NGUYN NHN  Tr?ng l??ng c? th? t?ng. ?i?u ny t?o p l?c ln d? dy, lm t?ng axit t? d? dy vo th?c qu?n.  Ht thu?c l. Ht thu?c l lm t?ng s?n sinh axit trong d? dy.  U?ng r??u. ?y l nguyn nhn lm gi?m p l?c trong c? th?t th?c qu?n d??i (van ho?c vng c? gi?a th?c qu?n v d? dy), cho php axit t? d? dy vo th?c qu?n.  ?n t?i mu?n v b?ng no. Tnh tr?ng ny lm t?ng p l?c c?ng nh? t?ng s?n sinh axit trong d? dy.  D? t?t c? th?t th?c qu?n d??i. ?i khi khng tm th?y nguyn nhn. TRI?U CH?NG  ?au rt ? ph?n d??i gi?a ng?c pha sau x??ng ?c v ? khu v?c gi?a d? dy. Hi?n t??ng ny c th? x?y ra hai l?n m?t tu?n ho?c th??ng xuyn h?n.  Kh nu?t.  ?au h?ng.  Ho khan.  Cc tri?u ch?ng gi?ng hen suy?n, bao g?m t?c ng?c,kh th? ho?c th? kh kh. CH?N ?ON Chuyn gia ch?m [place] s?c kh?e c th? ch?n ?on GERD d?a trn cc tri?u ch?ng c?a b?n. Trong m?t s? tr??ng h?p, ch?p X quang v cc xt nghi?m khc c th? ???c ti?n hnh  ?? ki?m tra cc bi?n ch?ng ho?c tnh tr?ng c?a d? dy v th?c qu?n. ?I?U TR? Chuyn gia ch?m [place] s?c kh?e c th? khuy?n ngh? dng thu?c khng c?n k ??n ho?c thu?c c?n k ??n ?? gip gi?m s?n sinh axit. Hy h?i chuyn gia ch?m [place] s?c kh?e c?a b?n tr??c khi b?t ??u ho?c dng thm b?t k? lo?i thu?c m?i no. H??NG D?N CH?M [place] T?I NH  Thay ??i cc y?u t? m b?n c th? ki?m sot ???c. H?i chuyn gia ch?m [place] s?c kh?e ?? ???c h??ng d?n v? vi?c gi?m cn, b? thu?c l v s? d?ng r??u.  [place]rnh cc lo?i th?c ph?m v ?? u?ng lm cho cc tri?u ch?ng t?i t? h?n, ch?ng h?n nh?:  ?? u?ng c caffeine ho?c r??u.  S c la.  B?c h ho?c v? b?c h.  T?i v hnh ty.  Th?c ?n [place].  Tri cy h? cam, ch?ng h?n nh? cam, chanh hay chanh ty.  Cc th?c ?n c c chua, ch?ng h?n nh? n??c x?t, ?t, salsa (n??c x?t [place]) v bnh pizza.  Cc lo?i th?c ?n chin xo v nhi?u ch?t bo.  Trnh n?m xu?ng ng? 3 ti?ng tr??c gi? ?i ng? ho?c tr??c khi c m?t gi?c ng? ng?n.  ?n nh?ng b?a ?n nh?, th??ng xuyn h?n thay v cc b?a ?n l?n.  M?c qu?n o r?ng. Khng ?eo b?t c? th? g ch?t quanh th?t l?ng gy p l?c ln d? dy.  Nng ??u gi??ng cao ln t? 6 ??n 8 inch b?ng cc kh?i g? ?? gip b?n ng?. S? d?ng thm g?i s? khng c tc d?ng.  Ch? s? d?ng thu?c khng c?n k ??n ho?c thu?c c?n k ??n ?? gi?m ?au, gi?m c?m gic kh ch?u ho?c h? s?t theo ch? d?n c?a chuyn gia ch?m [place] s?c kh?e c?a b?n.  Khng dng thu?c atpirin, ibuprofen ho?c cc thu?c ch?ng vim khng c steroid (NSAID) khc. HY NGAY L?P T?C ?I KHM N?U:  B?n b? ?au ? cnh tay, c?, hm,  r?ng ho?c l?ng.  Hi?n t??ng ?au t?ng ln ho?c thay ??i theo c??ng ?? ho?c th?i gian.  B?n b? bu?n nn, nn ho?c ?? m? hi (tot m? hi).  B?n b? kh th? ho?c ng?t x?u.  Ch?t nn c mu xanh l cy, vng, ?en ho?c trng gi?ng nh? b c ph ho?c mu.  Phn c mu ??, ?? nh? mu ho?c ?en. Nh?ng tri?u ch?ng ny c th? l d?u hi?u c?a cc v?n ?? khc, ch?ng h?n nh? b?nh  tim, ch?y mu d? dy ho?c ch?y mu th?c qu?n. ??M B?O B?N:  Hi?u cc h??ng d?n ny.  S? theo di tnh tr?ng c?a mnh.  S? yu c?u tr? gip ngay l?p t?c n?u b?n c?m th?y khng ?? ho?c tnh tr?ng tr?m tr?ng h?n. Document Released: 06/11/2005 Document Revised: 05/04/2013 [facility] Patient Information 2015 [place], [place]. This information is not intended to replace advice given to you by your health care provider. Make sure you discuss any questions you have with your health care provider.

## 2015-02-19 NOTE — Progress Notes (Signed)
   Subjective:    Patient ID: Nicolas Thompson, male    DOB: 12/29/1960, 54 y.o.   MRN: [id number]  HPI Poison ivy exposure, has allergys and bronchospasm any way. No infection, no sob, does have cough. No fever, or production   Review of Systems     Objective:   Physical Exam  Constitutional: He is oriented to person, place, and time. He appears well-developed and well-nourished.  HENT:  Head: Normocephalic.  Mouth/Throat: Oropharynx is clear and moist.  Eyes: Conjunctivae and EOM are normal. Pupils are equal, round, and reactive to light.  Neck: Normal range of motion. Neck supple.  Cardiovascular: Normal rate, regular rhythm and normal heart sounds.   Pulmonary/Chest: Effort normal and breath sounds normal.  Musculoskeletal: Normal range of motion.  Neurological: He is alert and oriented to person, place, and time. He exhibits normal muscle tone. Coordination normal.  Skin: Skin is intact. Rash noted. Rash is maculopapular. There is erythema.     Classic plaque of PI  Psychiatric: He has a normal mood and affect.          Assessment & Plan:  Depomedrol/Prednisone taper Cetirizine/benedryl HC cream  GERD care

## 2015-10-29 ENCOUNTER — Ambulatory Visit (INDEPENDENT_AMBULATORY_CARE_PROVIDER_SITE_OTHER): Payer: BLUE CROSS/BLUE SHIELD

## 2015-10-29 ENCOUNTER — Ambulatory Visit (INDEPENDENT_AMBULATORY_CARE_PROVIDER_SITE_OTHER): Payer: BLUE CROSS/BLUE SHIELD | Admitting: Physician Assistant

## 2015-10-29 ENCOUNTER — Telehealth: Payer: Self-pay | Admitting: Physician Assistant

## 2015-10-29 VITALS — BP 127/81 | HR 91 | Temp 100.0°F | Resp 17 | Ht 66.54 in | Wt 157.0 lb

## 2015-10-29 DIAGNOSIS — R509 Fever, unspecified: Secondary | ICD-10-CM | POA: Diagnosis not present

## 2015-10-29 DIAGNOSIS — J101 Influenza due to other identified influenza virus with other respiratory manifestations: Secondary | ICD-10-CM

## 2015-10-29 DIAGNOSIS — R05 Cough: Secondary | ICD-10-CM

## 2015-10-29 DIAGNOSIS — R059 Cough, unspecified: Secondary | ICD-10-CM

## 2015-10-29 LAB — POCT INFLUENZA A/B
INFLUENZA A, POC: POSITIVE — AB
Influenza B, POC: NEGATIVE

## 2015-10-29 MED ORDER — HYDROCOD POLST-CPM POLST ER 10-8 MG/5ML PO SUER: 5.0000 mL | Freq: Every evening | ORAL | Status: DC | PRN

## 2015-10-29 MED ORDER — GUAIFENESIN ER 1200 MG PO TB12: 1.0000 | ORAL_TABLET | Freq: Two times a day (BID) | ORAL | Status: DC | PRN

## 2015-10-29 MED ORDER — IBUPROFEN 200 MG PO TABS
600.0000 mg | ORAL_TABLET | Freq: Once | ORAL | Status: AC
  Administered 2015-10-29: 600 mg via ORAL

## 2015-10-29 NOTE — Progress Notes (Signed)
[facility] [address], [place] [address] [phone]  Date:  10/29/2015   Name:  Nicolas Thompson   DOB:  Jan 15, 1961   MRN:  [id number]  PCP:  No PCP Per Patient    History of Present Illness:  Nicolas Thompson is a 55 y.o. male patient who presents to [facility] for cc of dyspnea and cough.     Fever, cough, and chest tightness about 3 days.  Dry cough, and chest tightness.  He has had subjective fever and chills.  No ear pain or sore throat.  Chest pain secondary to cough, that also causes some shortness of breath with the pain.  He has taken nothing for his symptoms.        There are no active problems to display for this patient.   Past Medical History  Diagnosis Date  . Neuromuscular disorder (HCC)     No past surgical history on file.  Social History  Substance Use Topics  . Smoking status: Never Smoker   . Smokeless tobacco: None  . Alcohol Use: No    No family history on file.  No Known Allergies  Medication list has been reviewed and updated.  Current Outpatient Prescriptions on File Prior to Visit  Medication Sig Dispense Refill  . albuterol (PROVENTIL HFA;VENTOLIN HFA) 108 (90 BASE) MCG/ACT inhaler Inhale 2 puffs into the lungs every 4 (four) hours as needed for wheezing or shortness of breath (cough, shortness of breath or wheezing.). 1 Inhaler 1  . chlorpheniramine-HYDROcodone (TUSSIONEX PENNKINETIC ER) 10-8 MG/5ML SUER Take 5 mLs by mouth 2 (two) times daily. (Patient not taking: Reported on 02/07/2015) 60 mL 0  . fexofenadine-pseudoephedrine (ALLEGRA-D ALLERGY & CONGESTION) 180-240 MG per 24 hr tablet Take 1 tablet by mouth daily. 30 tablet 0  . omeprazole (PRILOSEC) 20 MG capsule Take 1 capsule (20 mg total) by mouth daily. 30 capsule 3  . predniSONE (DELTASONE) 20 MG tablet 3-3-3-3-2-2-2-2-1-1-1-1 po pc divided doses for poison ivy24 24 tablet 0   No current facility-administered medications on file prior to visit.    ROS ROS otherwise  unremarkable unless listed above.   Physical Examination: BP 127/81 mmHg  Pulse 91  Temp(Src) 100 F (37.8 C) (Oral)  Resp 17  Ht 5' 6.53" (1.69 m)  Wt 157 lb (71.215 kg)  BMI 24.93 kg/m2  SpO2 96% Ideal Body Weight: Weight in (lb) to have BMI = 25: 157.1  Physical Exam  HENT:  Right Ear: External ear and ear canal normal. Tympanic membrane is injected. Tympanic membrane is not perforated and not bulging.  Left Ear: External ear and ear canal normal. Tympanic membrane is injected. Tympanic membrane is not perforated and not bulging.  Nose: Mucosal edema and rhinorrhea present.  Mouth/Throat: No oropharyngeal exudate, posterior oropharyngeal edema or posterior oropharyngeal erythema.  Eyes: Right conjunctiva is injected. Left conjunctiva is injected.  Cardiovascular: Normal rate and regular rhythm.  Exam reveals no gallop.   No murmur heard. Pulmonary/Chest: No apnea. No respiratory distress. He has no decreased breath sounds. He has no wheezes. He has rhonchi.  Increased sounds at the llb  Lymphadenopathy:       Head (right side): No tonsillar adenopathy present.       Head (left side): No tonsillar adenopathy present.    He has no cervical adenopathy.  Psychiatric: He has a normal mood and affect. His behavior is normal.     Results for orders placed or performed in visit on 10/29/15  POCT Influenza A/B  Result Value Ref Range   Influenza A, POC Positive (A) Negative   Influenza B, POC Negative Negative    Dg Chest 2 View  10/29/2015  CLINICAL DATA:  Acute fever, cough and chest tightness for 2 days EXAM: CHEST  2 VIEW COMPARISON:  02/07/2015 FINDINGS: The heart size and mediastinal contours are within normal limits. Both lungs are clear. The visualized skeletal structures are unremarkable. IMPRESSION: No active cardiopulmonary disease. Electronically Signed   By: Judie Petit.  Shick M.D.   On: 10/29/2015 19:54    Assessment and Plan: Nicolas Thompson is a 55 y.o. male who is here today  for fever and cough. During physical exam, temperature increased to 102.4.  Given ibuprofen for fever.   Positive flu.  Advised to use anti-pyretic.   Treating supportively at this time.   Influenza A - Plan: Guaifenesin (MUCINEX MAXIMUM STRENGTH) 1200 MG TB12, chlorpheniramine-HYDROcodone (TUSSIONEX PENNKINETIC ER) 10-8 MG/5ML SUER  Cough - Plan: POCT Influenza A/B, DG Chest 2 View, ibuprofen (ADVIL,MOTRIN) tablet 600 mg, Guaifenesin (MUCINEX MAXIMUM STRENGTH) 1200 MG TB12  Fever, unspecified fever cause - Plan: ibuprofen (ADVIL,MOTRIN) tablet 600 mg, Guaifenesin (MUCINEX MAXIMUM STRENGTH) 1200 MG TB12  Trena Platt, PA-C [facility] 2/14/20178:43 AM

## 2015-10-29 NOTE — Patient Instructions (Addendum)
Because you received an x-ray today, you will receive an invoice from [facility]. Please contact [facility] at [phone] with questions or concerns regarding your invoice. Our billing staff will not be able to assist you with those questions.  He needs to hydrate well with water (64 oz of water which is almost 4 regular sized water bottles). Please take ibuprofen or tylenol for fever and pain. I would like you to let me know if the fever does not improve within the next 48 hours.   Please take medications as prescribed.    Influenza, Adult Influenza ("the flu") is a viral infection of the respiratory tract. It occurs more often in winter months because people spend more time in close contact with one another. Influenza can make you feel very sick. Influenza easily spreads from person to person (contagious). CAUSES  Influenza is caused by a virus that infects the respiratory tract. You can catch the virus by breathing in droplets from an infected person's cough or sneeze. You can also catch the virus by touching something that was recently contaminated with the virus and then touching your mouth, nose, or eyes. RISKS AND COMPLICATIONS You may be at risk for a more severe case of influenza if you smoke cigarettes, have diabetes, have chronic heart disease (such as heart failure) or lung disease (such as asthma), or if you have a weakened immune system. Elderly people and pregnant women are also at risk for more serious infections. The most common problem of influenza is a lung infection (pneumonia). Sometimes, this problem can require emergency medical care and may be life threatening. SIGNS AND SYMPTOMS  Symptoms typically last 4 to 10 days and may include:  Fever.  Chills.  Headache, body aches, and muscle aches.  Sore throat.  Chest discomfort and cough.  Poor appetite.  Weakness or feeling tired.  Dizziness.  Nausea or vomiting. DIAGNOSIS  Diagnosis of  influenza is often made based on your history and a physical exam. A nose or throat swab test can be done to confirm the diagnosis. TREATMENT  In mild cases, influenza goes away on its own. Treatment is directed at relieving symptoms. For more severe cases, your health care provider may prescribe antiviral medicines to shorten the sickness. Antibiotic medicines are not effective because the infection is caused by a virus, not by bacteria. HOME CARE INSTRUCTIONS  Take medicines only as directed by your health care provider.  Use a cool mist humidifier to make breathing easier.  Get plenty of rest until your temperature returns to normal. This usually takes 3 to 4 days.  Drink enough fluid to keep your urine clear or pale yellow.  Cover yourmouth and nosewhen coughing or sneezing,and wash your handswellto prevent thevirusfrom spreading.  Stay homefromwork orschool untilthe fever is gonefor at least 57full day. PREVENTION  An annual influenza vaccination (flu shot) is the best way to avoid getting influenza. An annual flu shot is now routinely recommended for all adults in the [place] SEEK MEDICAL CARE IF:  You experiencechest pain, yourcough worsens,or you producemore mucus.  Youhave nausea,vomiting, ordiarrhea.  Your fever returns or gets worse. SEEK IMMEDIATE MEDICAL CARE IF:  You havetrouble breathing, you become short of breath,or your skin ornails becomebluish.  You have severe painor stiffnessin the neck.  You develop a sudden headache, or pain in the face or ear.  You have nausea or vomiting that you cannot control. MAKE SURE YOU:   Understand these instructions.  Will watch your condition.  Will get help right away if you are not doing well or get worse.   This information is not intended to replace advice given to you by your health care provider. Make sure you discuss any questions you have with your health care provider.   Document Released:  08/29/2000 Document Revised: 09/22/2014 Document Reviewed: 12/01/2011 Elsevier Interactive Patient Education [employer]

## 2015-12-05 ENCOUNTER — Ambulatory Visit (INDEPENDENT_AMBULATORY_CARE_PROVIDER_SITE_OTHER): Payer: BLUE CROSS/BLUE SHIELD | Admitting: Family Medicine

## 2015-12-05 VITALS — BP 122/80 | HR 61 | Temp 97.7°F | Resp 20 | Ht 66.0 in | Wt 160.0 lb

## 2015-12-05 DIAGNOSIS — G4700 Insomnia, unspecified: Secondary | ICD-10-CM | POA: Diagnosis not present

## 2015-12-05 DIAGNOSIS — H8110 Benign paroxysmal vertigo, unspecified ear: Secondary | ICD-10-CM | POA: Diagnosis not present

## 2015-12-05 MED ORDER — HYDROXYZINE HCL 25 MG PO TABS: 12.5000 mg | ORAL_TABLET | Freq: Three times a day (TID) | ORAL | Status: DC | PRN

## 2015-12-05 NOTE — Progress Notes (Signed)
Subjective:    Patient ID: Nicolas Thompson, male    DOB: 10/24/1960, 55 y.o.   MRN: [id number]  HPI This is a 55 yo [place] male who is accompanied by a friend who is helping with language barrier. He presents today with dizziness for 3 days. He was fine prior to it starting abruptly. He is having episodes 3-4 x/ day. Has sensation of spinning. No syncope. Feels "heavy in his head." No pain, feels some numbness of his head when episodes occur. Lasts about 30 minutes. Worse with moving head. No nausea. Feels a little blurred during episodes.  Has had some episodes upon awakening. No falls or head trauma.   Has had difficulty sleeping for 10 years. Has trouble going to sleep and falling asleep. Does not get regular exercise, but has an active job in [employer]. He goes to be around midnight and wakes up around 2-3 and has difficulty going back to sleep. Usually doesn't feel tired. No excessive stress or anxiety. No caffeine intake. Works inside. Occasionally naps, but limits to 15 minutes. Usually reads new on the internet prior to going to sleep.     Past Medical History  Diagnosis Date  . Neuromuscular disorder (HCC)   . Allergy    No past surgical history on file. No family history on file. Social History  Substance Use Topics  . Smoking status: Never Smoker   . Smokeless tobacco: None  . Alcohol Use: No    Review of Systems  Constitutional: Positive for fatigue (with spells). Negative for fever.  HENT: Negative for congestion, ear pain and rhinorrhea.   Respiratory: Negative for cough and shortness of breath.   Cardiovascular: Negative for chest pain and leg swelling.  Neurological: Positive for dizziness. Negative for syncope, numbness and headaches.  Psychiatric/Behavioral: Negative for dysphoric mood. The patient is not nervous/anxious.        Objective:   Physical Exam  Constitutional: He is oriented to person, place, and time. He appears well-developed and well-nourished.  No distress.  HENT:  Head: Normocephalic and atraumatic.  Right Ear: External ear normal.  Left Ear: External ear normal.  Nose: Nose normal.  Mouth/Throat: Oropharynx is clear and moist.  Eyes: Conjunctivae and EOM are normal. Pupils are equal, round, and reactive to light. Right eye exhibits no discharge. Left eye exhibits no discharge.  Neck: Normal range of motion. Neck supple.  Cardiovascular: Normal rate, regular rhythm and normal heart sounds.   Pulmonary/Chest: Effort normal and breath sounds normal.  Musculoskeletal: Normal range of motion.  Lymphadenopathy:    He has no cervical adenopathy.  Neurological: He is alert and oriented to person, place, and time. He has normal reflexes. No cranial nerve deficit.  Able to reproduce symptoms with Epley maneuver to left. No nystagmus. Episode resolved in 2-3 minutes.   Skin: Skin is warm and dry. He is not diaphoretic.  Psychiatric: He has a normal mood and affect. His behavior is normal. Judgment and thought content normal.   BP 122/80 mmHg  Pulse 61  Temp(Src) 97.7 F (36.5 C) (Oral)  Resp 20  Ht 5\' 6"  (1.676 m)  Wt 160 lb (72.576 kg)  BMI 25.84 kg/m2  SpO2 98%      Assessment & Plan:  1. BPPV (benign paroxysmal positional vertigo), unspecified laterality -Provided written and verbal information regarding diagnosis and treatment. - hydrOXYzine (ATARAX/VISTARIL) 25 MG tablet; Take 0.5-1 tablets (12.5-25 mg total) by mouth every 8 (eight) hours as needed.  Dispense: 30 tablet; Refill:  0 - RTC precautions- worsening symptoms, visual changes, headache, persistent vomiting, weakness  2. Insomnia - discussed sleep hygiene and encouraged him to try a trial of melatonin for 2 week - hydrOXYzine (ATARAX/VISTARIL) 25 MG tablet; Take 0.5-1 tablets (12.5-25 mg total) by mouth every 8 (eight) hours as needed.  Dispense: 30 tablet; Refill: 0   Olean Ree, FNP-BC  [facility]  12/05/2015 10:23 AM

## 2015-12-05 NOTE — Patient Instructions (Addendum)
For sleep you can take over the counter Melatonin 4 mg. Try for 2 weeks.    Epley Maneuver Self-Care WHAT IS THE EPLEY MANEUVER? The Epley maneuver is an exercise you can do to relieve symptoms of benign paroxysmal positional vertigo (BPPV). This condition is often just referred to as vertigo. BPPV is caused by the movement of tiny crystals (canaliths) inside your inner ear. The accumulation and movement of canaliths in your inner ear causes a sudden spinning sensation (vertigo) when you move your head to certain positions. Vertigo usually lasts about 30 seconds. BPPV usually occurs in just one ear. If you get vertigo when you lie on your left side, you probably have BPPV in your left ear. Your health care provider can tell you which ear is involved.  BPPV may be caused by a head injury. Many people older than 50 get BPPV for unknown reasons. If you have been diagnosed with BPPV, your health care provider may teach you how to do this maneuver. BPPV is not life threatening (benign) and usually goes away in time.  WHEN SHOULD I PERFORM THE EPLEY MANEUVER? You can do this maneuver at home whenever you have symptoms of vertigo. You may do the Epley maneuver up to 3 times a day until your symptoms of vertigo go away. HOW SHOULD I DO THE EPLEY MANEUVER? 1. Sit on the edge of a bed or table with your back straight. Your legs should be extended or hanging over the edge of the bed or table.  2. Turn your head halfway toward the affected ear.  3. Lie backward quickly with your head turned until you are lying flat on your back. You may want to position a pillow under your shoulders.  4. Hold this position for 30 seconds. You may experience an attack of vertigo. This is normal. Hold this position until the vertigo stops. 5. Then turn your head to the opposite direction until your unaffected ear is facing the floor.  6. Hold this position for 30 seconds. You may experience an attack of vertigo. This is normal.  Hold this position until the vertigo stops. 7. Now turn your whole body to the same side as your head. Hold for another 30 seconds.  8. You can then sit back up. ARE THERE RISKS TO THIS MANEUVER? In some cases, you may have other symptoms (such as changes in your vision, weakness, or numbness). If you have these symptoms, stop doing the maneuver and call your health care provider. Even if doing these maneuvers relieves your vertigo, you may still have dizziness. Dizziness is the sensation of light-headedness but without the sensation of movement. Even though the Epley maneuver may relieve your vertigo, it is possible that your symptoms will return within 5 years. WHAT SHOULD I DO AFTER THIS MANEUVER? After doing the Epley maneuver, you can return to your normal activities. Ask your doctor if there is anything you should do at home to prevent vertigo. This may include:  Sleeping with two or more pillows to keep your head elevated.  Not sleeping on the side of your affected ear.  Getting up slowly from bed.  Avoiding sudden movements during the day.  Avoiding extreme head movement, like looking up or bending over.  Wearing a cervical collar to prevent sudden head movements. WHAT SHOULD I DO IF MY SYMPTOMS GET WORSE? Call your health care provider if your vertigo gets worse. Call your provider right way if you have other symptoms, including:   Nausea.  Vomiting.  Headache.  Weakness.  Numbness.  Vision changes.   This information is not intended to replace advice given to you by your health care provider. Make sure you discuss any questions you have with your health care provider.   Document Released: 09/06/2013 Document Reviewed: 09/06/2013 Elsevier Interactive Patient Education 2016 Elsevier Inc.  Benign Positional Vertigo Vertigo is the feeling that you or your surroundings are moving when they are not. Benign positional vertigo is the most common form of vertigo. The cause  of this condition is not serious (is benign). This condition is triggered by certain movements and positions (is positional). This condition can be dangerous if it occurs while you are doing something that could endanger you or others, such as driving.  CAUSES In many cases, the cause of this condition is not known. It may be caused by a disturbance in an area of the inner ear that helps your brain to sense movement and balance. This disturbance can be caused by a viral infection (labyrinthitis), head injury, or repetitive motion. RISK FACTORS This condition is more likely to develop in: 9. Women. 10. People who are 71 years of age or older. SYMPTOMS Symptoms of this condition usually happen when you move your head or your eyes in different directions. Symptoms may start suddenly, and they usually last for less than a minute. Symptoms may include:  Loss of balance and falling.  Feeling like you are spinning or moving.  Feeling like your surroundings are spinning or moving.  Nausea and vomiting.  Blurred vision.  Dizziness.  Involuntary eye movement (nystagmus). Symptoms can be mild and cause only slight annoyance, or they can be severe and interfere with daily life. Episodes of benign positional vertigo may return (recur) over time, and they may be triggered by certain movements. Symptoms may improve over time. DIAGNOSIS This condition is usually diagnosed by medical history and a physical exam of the head, neck, and ears. You may be referred to a health care provider who specializes in ear, nose, and throat (ENT) problems (otolaryngologist) or a provider who specializes in disorders of the nervous system (neurologist). You may have additional testing, including:  MRI.  A CT scan.  Eye movement tests. Your health care provider may ask you to change positions quickly while he or she watches you for symptoms of benign positional vertigo, such as nystagmus. Eye movement may be tested with  an electronystagmogram (ENG), caloric stimulation, the Dix-Hallpike test, or the roll test.  An electroencephalogram (EEG). This records electrical activity in your brain.  Hearing tests. TREATMENT Usually, your health care provider will treat this by moving your head in specific positions to adjust your inner ear back to normal. Surgery may be needed in severe cases, but this is rare. In some cases, benign positional vertigo may resolve on its own in 2-4 weeks. HOME CARE INSTRUCTIONS Safety  Move slowly.Avoid sudden body or head movements.  Avoid driving.  Avoid operating heavy machinery.  Avoid doing any tasks that would be dangerous to you or others if a vertigo episode would occur.  If you have trouble walking or keeping your balance, try using a cane for stability. If you feel dizzy or unstable, sit down right away.  Return to your normal activities as told by your health care provider. Ask your health care provider what activities are safe for you. General Instructions  Take over-the-counter and prescription medicines only as told by your health care provider.  Avoid certain positions or movements  as told by your health care provider.  Drink enough fluid to keep your urine clear or pale yellow.  Keep all follow-up visits as told by your health care provider. This is important. SEEK MEDICAL CARE IF:  You have a fever.  Your condition gets worse or you develop new symptoms.  Your family or friends notice any behavioral changes.  Your nausea or vomiting gets worse.  You have numbness or a "pins and needles" sensation. SEEK IMMEDIATE MEDICAL CARE IF:  You have difficulty speaking or moving.  You are always dizzy.  You faint.  You develop severe headaches.  You have weakness in your legs or arms.  You have changes in your hearing or vision.  You develop a stiff neck.  You develop sensitivity to light.   This information is not intended to replace advice  given to you by your health care provider. Make sure you discuss any questions you have with your health care provider.   Document Released: 06/09/2006 Document Revised: 05/23/2015 Document Reviewed: 12/25/2014 Elsevier Interactive Patient Education [employer]    IF you received an x-ray today, you will receive an invoice from [facility]. Please contact [facility] at [phone] with questions or concerns regarding your invoice.   IF you received labwork today, you will receive an invoice from [employer]. Please contact [facility] at [phone] with questions or concerns regarding your invoice.   Our billing staff will not be able to assist you with questions regarding bills from these companies.  You will be contacted with the lab results as soon as they are available. The fastest way to get your results is to activate your My Chart account. Instructions are located on the last page of this paperwork. If you have not heard from [place] regarding the results in 2 weeks, please contact this office.

## 2015-12-10 ENCOUNTER — Ambulatory Visit (INDEPENDENT_AMBULATORY_CARE_PROVIDER_SITE_OTHER): Payer: BLUE CROSS/BLUE SHIELD | Admitting: Family Medicine

## 2015-12-10 VITALS — BP 120/68 | HR 68 | Temp 98.6°F | Resp 20 | Ht 66.14 in | Wt 158.8 lb

## 2015-12-10 DIAGNOSIS — H8112 Benign paroxysmal vertigo, left ear: Secondary | ICD-10-CM

## 2015-12-10 DIAGNOSIS — R42 Dizziness and giddiness: Secondary | ICD-10-CM | POA: Diagnosis not present

## 2015-12-10 LAB — COMPREHENSIVE METABOLIC PANEL
ALK PHOS: 70 U/L (ref 40–115)
ALT: 92 U/L — AB (ref 9–46)
AST: 66 U/L — AB (ref 10–35)
Albumin: 4.3 g/dL (ref 3.6–5.1)
BILIRUBIN TOTAL: 0.5 mg/dL (ref 0.2–1.2)
BUN: 16 mg/dL (ref 7–25)
CALCIUM: 9.2 mg/dL (ref 8.6–10.3)
CO2: 24 mmol/L (ref 20–31)
Chloride: 104 mmol/L (ref 98–110)
Creat: 1 mg/dL (ref 0.70–1.33)
GLUCOSE: 85 mg/dL (ref 65–99)
Potassium: 4.4 mmol/L (ref 3.5–5.3)
Sodium: 140 mmol/L (ref 135–146)
Total Protein: 7.4 g/dL (ref 6.1–8.1)

## 2015-12-10 LAB — POCT CBC
Granulocyte percent: 66.4 %G (ref 37–80)
HCT, POC: 43.3 % — AB (ref 43.5–53.7)
HEMOGLOBIN: 15.8 g/dL (ref 14.1–18.1)
LYMPH, POC: 1.8 (ref 0.6–3.4)
MCH, POC: 32.9 pg — AB (ref 27–31.2)
MCHC: 36.6 g/dL — AB (ref 31.8–35.4)
MCV: 89.9 fL (ref 80–97)
MID (cbc): 0.6 (ref 0–0.9)
MPV: 6.7 fL (ref 0–99.8)
POC Granulocyte: 4.7 (ref 2–6.9)
POC LYMPH PERCENT: 25.2 %L (ref 10–50)
POC MID %: 8.4 % (ref 0–12)
Platelet Count, POC: 160 10*3/uL (ref 142–424)
RBC: 4.81 M/uL (ref 4.69–6.13)
RDW, POC: 13.4 %
WBC: 7.1 10*3/uL (ref 4.6–10.2)

## 2015-12-10 LAB — POCT URINALYSIS DIP (MANUAL ENTRY)
BILIRUBIN UA: NEGATIVE
BILIRUBIN UA: NEGATIVE
GLUCOSE UA: NEGATIVE
Leukocytes, UA: NEGATIVE
Nitrite, UA: NEGATIVE
Protein Ur, POC: NEGATIVE
SPEC GRAV UA: 1.02
UROBILINOGEN UA: 0.2
pH, UA: 5

## 2015-12-10 LAB — GLUCOSE, POCT (MANUAL RESULT ENTRY): POC Glucose: 96 mg/dl (ref 70–99)

## 2015-12-10 LAB — TSH: TSH: 1.91 mIU/L (ref 0.40–4.50)

## 2015-12-10 NOTE — Progress Notes (Signed)
Subjective:    Patient ID: Nicolas Thompson, male    DOB: 1961-05-25, 55 y.o.   MRN: [id number]  12/10/2015  Follow-up   HPI This 55 y.o. male presents with daughter for evaluation of persistent vertigo. Evaluated five days ago with vertigo; prescribed hydroxyzine PRN.  Onset of dizziness two weeks.  No headache but heavy head.  No tinnitus; normal hearing.  Mild blurred vision; no diplopia. Room is spinning.  Intermittent.  Turning head does not trigger.  Will occur with laying down.  No n/t.  No falls.  No chest pain.  Taking Melatonin qhs for insomnia. Taking hydroxyzine once daily during the day.  No improvement with dizziness.  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  No recent congestion.  Recent influenza; now much better.  Dizziness occurs more when laying down and getting up in the morning or after a nap; duration one minute.  Today is better compared to last [facility] maneuver.  Having 6-7 episodes per day.   Review of Systems  Constitutional: Negative for fever, chills, diaphoresis, activity change, appetite change and fatigue.  Eyes: Positive for visual disturbance. Negative for photophobia and pain.  Respiratory: Negative for cough and shortness of breath.   Cardiovascular: Negative for chest pain, palpitations and leg swelling.  Gastrointestinal: Negative for nausea, vomiting, abdominal pain and diarrhea.  Endocrine: Negative for cold intolerance, heat intolerance, polydipsia, polyphagia and polyuria.  Skin: Negative for color change, rash and wound.  Neurological: Positive for dizziness. Negative for tremors, seizures, syncope, facial asymmetry, speech difficulty, weakness, light-headedness, numbness and headaches.  Psychiatric/Behavioral: Negative for confusion, sleep disturbance and dysphoric mood. The patient is not nervous/anxious.     Past Medical History  Diagnosis Date  . Neuromuscular disorder (HCC)   . Allergy    History reviewed. No pertinent past surgical  history. No Known Allergies Current Outpatient Prescriptions  Medication Sig Dispense Refill  . albuterol (PROVENTIL HFA;VENTOLIN HFA) 108 (90 BASE) MCG/ACT inhaler Inhale 2 puffs into the lungs every 4 (four) hours as needed for wheezing or shortness of breath (cough, shortness of breath or wheezing.). 1 Inhaler 1  . fexofenadine-pseudoephedrine (ALLEGRA-D ALLERGY & CONGESTION) 180-240 MG per 24 hr tablet Take 1 tablet by mouth daily. 30 tablet 0  . hydrOXYzine (ATARAX/VISTARIL) 25 MG tablet Take 0.5-1 tablets (12.5-25 mg total) by mouth every 8 (eight) hours as needed. 30 tablet 0   No current facility-administered medications for this visit.   Social History   Social History  . Marital Status: Married    Spouse Name: N/A  . Number of Children: N/A  . Years of Education: N/A   Occupational History  . Not on file.   Social History Main Topics  . Smoking status: Never Smoker   . Smokeless tobacco: Not on file  . Alcohol Use: No  . Drug Use: No  . Sexual Activity: Yes   Other Topics Concern  . Not on file   Social History Narrative   History reviewed. No pertinent family history.     Objective:    BP 120/68 mmHg  Pulse 68  Temp(Src) 98.6 F (37 C) (Oral)  Resp 20  Ht 5' 6.14" (1.68 m)  Wt 158 lb 12.8 oz (72.031 kg)  BMI 25.52 kg/m2  SpO2 98% Physical Exam  Constitutional: He is oriented to person, place, and time. He appears well-developed and well-nourished. No distress.  HENT:  Head: Normocephalic and atraumatic.  Right Ear: External ear normal.  Left Ear: External ear normal.  Nose: Nose normal.  Mouth/Throat: Oropharynx is clear and moist.  Eyes: Conjunctivae and EOM are normal. Pupils are equal, round, and reactive to light.  Neck: Normal range of motion. Neck supple. Carotid bruit is not present. No thyromegaly present.  Cardiovascular: Normal rate, regular rhythm, normal heart sounds and intact distal pulses.  Exam reveals no gallop and no friction rub.    No murmur heard. Pulmonary/Chest: Effort normal and breath sounds normal. He has no wheezes. He has no rales.  Abdominal: Soft. Bowel sounds are normal. He exhibits no distension and no mass. There is no tenderness. There is no rebound and no guarding.  Lymphadenopathy:    He has no cervical adenopathy.  Neurological: He is alert and oriented to person, place, and time. He has normal reflexes. No cranial nerve deficit. He exhibits normal muscle tone. Coordination normal.  Dix-Hallpike to L reproduces dizziness; no nystagmus appreciated. Negative Dix-Hallpike to R.    Skin: Skin is warm and dry. No rash noted. He is not diaphoretic.  Psychiatric: He has a normal mood and affect. His behavior is normal. Judgment and thought content normal.  Nursing note and vitals reviewed.  Results for orders placed or performed in visit on 12/10/15  Comprehensive metabolic panel  Result Value Ref Range   Sodium 140 135 - 146 mmol/L   Potassium 4.4 3.5 - 5.3 mmol/L   Chloride 104 98 - 110 mmol/L   CO2 24 20 - 31 mmol/L   Glucose, Bld 85 65 - 99 mg/dL   BUN 16 7 - 25 mg/dL   Creat 4.781.00 2.950.70 - 6.211.33 mg/dL   Total Bilirubin 0.5 0.2 - 1.2 mg/dL   Alkaline Phosphatase 70 40 - 115 U/L   AST 66 (H) 10 - 35 U/L   ALT 92 (H) 9 - 46 U/L   Total Protein 7.4 6.1 - 8.1 g/dL   Albumin 4.3 3.6 - 5.1 g/dL   Calcium 9.2 8.6 - 30.810.3 mg/dL  TSH  Result Value Ref Range   TSH  0.40 - 4.50 mIU/L  POCT CBC  Result Value Ref Range   WBC 7.1 4.6 - 10.2 K/uL   Lymph, poc 1.8 0.6 - 3.4   POC LYMPH PERCENT 25.2 10 - 50 %L   MID (cbc) 0.6 0 - 0.9   POC MID % 8.4 0 - 12 %M   POC Granulocyte 4.7 2 - 6.9   Granulocyte percent 66.4 37 - 80 %G   RBC 4.81 4.69 - 6.13 M/uL   Hemoglobin 15.8 14.1 - 18.1 g/dL   HCT, POC 65.743.3 (A) 84.643.5 - 53.7 %   MCV 89.9 80 - 97 fL   MCH, POC 32.9 (A) 27 - 31.2 pg   MCHC 36.6 (A) 31.8 - 35.4 g/dL   RDW, POC 96.213.4 %   Platelet Count, POC 160 142 - 424 K/uL   MPV 6.7 0 - 99.8 fL  POCT glucose  (manual entry)  Result Value Ref Range   POC Glucose 96 70 - 99 mg/dl  POCT urinalysis dipstick  Result Value Ref Range   Color, UA yellow yellow   Clarity, UA clear clear   Glucose, UA negative negative   Bilirubin, UA negative negative   Ketones, POC UA negative negative   Spec Grav, UA 1.020    Blood, UA trace-intact (A) negative   pH, UA 5.0    Protein Ur, POC negative negative   Urobilinogen, UA 0.2    Nitrite, UA Negative Negative   Leukocytes,  UA Negative Negative   EKG: sinus bradycardia; peaked T waves    Assessment & Plan:   1. Dizziness   2. Benign paroxysmal positional vertigo, left    -Persistent. -Epley maneuvers provided for patient to perform daily. -obtain labs with possible peaked T waves on EKG. -refer to ENT for further evaluation. -normal neurological exam in office. -continue Hydroxyzine PRN.   Orders Placed This Encounter  Procedures  . Comprehensive metabolic panel  . TSH  . Ambulatory referral to ENT    Referral Priority:  Routine    Referral Type:  Consultation    Referral Reason:  Specialty Services Required    Referred to Provider:  Christia Reading, MD    Requested Specialty:  Otolaryngology    Number of Visits Requested:  1  . POCT CBC  . POCT glucose (manual entry)  . POCT urinalysis dipstick  . EKG 12-Lead   No orders of the defined types were placed in this encounter.    No Follow-up on file.    Graycee Greeson Paulita Fujita, M.D. [facility] [address] [place], [place]  [address] [phone] phone [phone] fax

## 2015-12-10 NOTE — Patient Instructions (Addendum)
   IF you received an x-ray today, you will receive an invoice from [facility]. Please contact [facility] at [phone] with questions or concerns regarding your invoice.   IF you received labwork today, you will receive an invoice from [facility]. Please contact [facility] at [phone] with questions or concerns regarding your invoice.   Our billing staff will not be able to assist you with questions regarding bills from these companies.  You will be contacted with the lab results as soon as they are available. The fastest way to get your results is to activate your My Chart account. Instructions are located on the last page of this paperwork. If you have not heard from us regarding the results in 2 weeks, please contact this office.     Epley Maneuver Self-Care WHAT IS THE EPLEY MANEUVER? The Epley maneuver is an exercise you can do to relieve symptoms of benign paroxysmal positional vertigo (BPPV). This condition is often just referred to as vertigo. BPPV is caused by the movement of tiny crystals (canaliths) inside your inner ear. The accumulation and movement of canaliths in your inner ear causes a sudden spinning sensation (vertigo) when you move your head to certain positions. Vertigo usually lasts about 30 seconds. BPPV usually occurs in just one ear. If you get vertigo when you lie on your left side, you probably have BPPV in your left ear. Your health care provider can tell you which ear is involved.  BPPV may be caused by a head injury. Many people older than 50 get BPPV for unknown reasons. If you have been diagnosed with BPPV, your health care provider may teach you how to do this maneuver. BPPV is not life threatening (benign) and usually goes away in time.  WHEN SHOULD I PERFORM THE EPLEY MANEUVER? You can do this maneuver at home whenever you have symptoms of vertigo. You may do the Epley maneuver up to 3 times a day until your  symptoms of vertigo go away. HOW SHOULD I DO THE EPLEY MANEUVER? 1. Sit on the edge of a bed or table with your back straight. Your legs should be extended or hanging over the edge of the bed or table.  2. Turn your head halfway toward the affected ear.  3. Lie backward quickly with your head turned until you are lying flat on your back. You may want to position a pillow under your shoulders.  4. Hold this position for 30 seconds. You may experience an attack of vertigo. This is normal. Hold this position until the vertigo stops. 5. Then turn your head to the opposite direction until your unaffected ear is facing the floor.  6. Hold this position for 30 seconds. You may experience an attack of vertigo. This is normal. Hold this position until the vertigo stops. 7. Now turn your whole body to the same side as your head. Hold for another 30 seconds.  8. You can then sit back up. ARE THERE RISKS TO THIS MANEUVER? In some cases, you may have other symptoms (such as changes in your vision, weakness, or numbness). If you have these symptoms, stop doing the maneuver and call your health care provider. Even if doing these maneuvers relieves your vertigo, you may still have dizziness. Dizziness is the sensation of light-headedness but without the sensation of movement. Even though the Epley maneuver may relieve your vertigo, it is possible that your symptoms will return within 5 years. WHAT SHOULD I DO AFTER THIS   MANEUVER? After doing the Epley maneuver, you can return to your normal activities. Ask your doctor if there is anything you should do at home to prevent vertigo. This may include:  Sleeping with two or more pillows to keep your head elevated.  Not sleeping on the side of your affected ear.  Getting up slowly from bed.  Avoiding sudden movements during the day.  Avoiding extreme head movement, like looking up or bending over.  Wearing a cervical collar to prevent sudden head  movements. WHAT SHOULD I DO IF MY SYMPTOMS GET WORSE? Call your health care provider if your vertigo gets worse. Call your provider right way if you have other symptoms, including:   Nausea.  Vomiting.  Headache.  Weakness.  Numbness.  Vision changes.   This information is not intended to replace advice given to you by your health care provider. Make sure you discuss any questions you have with your health care provider.   Document Released: 09/06/2013 Document Reviewed: 09/06/2013 Elsevier Interactive Patient Education 2016 Else[employer] Inc.  

## 2016-01-07 ENCOUNTER — Ambulatory Visit (INDEPENDENT_AMBULATORY_CARE_PROVIDER_SITE_OTHER): Payer: BLUE CROSS/BLUE SHIELD | Admitting: Family Medicine

## 2016-01-07 VITALS — BP 134/82 | HR 60 | Temp 98.2°F | Resp 18 | Wt 163.0 lb

## 2016-01-07 DIAGNOSIS — J301 Allergic rhinitis due to pollen: Secondary | ICD-10-CM | POA: Diagnosis not present

## 2016-01-07 DIAGNOSIS — R9431 Abnormal electrocardiogram [ECG] [EKG]: Secondary | ICD-10-CM

## 2016-01-07 DIAGNOSIS — H8112 Benign paroxysmal vertigo, left ear: Secondary | ICD-10-CM | POA: Diagnosis not present

## 2016-01-07 DIAGNOSIS — R7989 Other specified abnormal findings of blood chemistry: Secondary | ICD-10-CM

## 2016-01-07 DIAGNOSIS — R945 Abnormal results of liver function studies: Secondary | ICD-10-CM

## 2016-01-07 LAB — COMPREHENSIVE METABOLIC PANEL
ALBUMIN: 4.2 g/dL (ref 3.6–5.1)
ALK PHOS: 50 U/L (ref 40–115)
ALT: 36 U/L (ref 9–46)
AST: 26 U/L (ref 10–35)
BUN: 17 mg/dL (ref 7–25)
CALCIUM: 8.8 mg/dL (ref 8.6–10.3)
CHLORIDE: 104 mmol/L (ref 98–110)
CO2: 26 mmol/L (ref 20–31)
Creat: 0.91 mg/dL (ref 0.70–1.33)
Glucose, Bld: 82 mg/dL (ref 65–99)
POTASSIUM: 4 mmol/L (ref 3.5–5.3)
Sodium: 140 mmol/L (ref 135–146)
TOTAL PROTEIN: 6.9 g/dL (ref 6.1–8.1)
Total Bilirubin: 0.4 mg/dL (ref 0.2–1.2)

## 2016-01-07 LAB — ACUTE HEP PANEL AND HEP B SURFACE AB
HCV Ab: NEGATIVE
HEP A IGM: NONREACTIVE
HEP B C IGM: NONREACTIVE
HEP B S AG: NEGATIVE
Hep B S Ab: POSITIVE — AB

## 2016-01-07 MED ORDER — MECLIZINE HCL 12.5 MG PO TABS: 12.5000 mg | ORAL_TABLET | Freq: Two times a day (BID) | ORAL | Status: DC

## 2016-01-07 NOTE — Patient Instructions (Addendum)
1. Start Zyrtec 10mg  one tablet every evening. 2.  Take Meclizine as needed for dizziness. 3. Continue to perform exercises for dizziness every morning.    IF you received an x-ray today, you will [facility] an [facility]. Please contact [facility] at [phone] with questions or concerns regarding your invoice.   IF you received labwork today, you will receive an invoice from [facility]. Please contact [facility] at [phone] with questions or concerns regarding your invoice.   Our billing staff will not be able to assist you with questions regarding bills from these companies.  You will be contacted with the lab results as soon as they are available. The fastest way to get your results is to activate your My Chart account. Instructions are located on the last page of this paperwork. If you have not heard from us regarding the results in 2 weeks, please contact this office.

## 2016-01-07 NOTE — Progress Notes (Signed)
Subjective:    Patient ID: Nicolas Thompson, male    DOB: 01/02/1961, 55 y.o.   MRN: [id number]  01/07/2016  Follow-up and Dizziness   HPI This 55 y.o. male presents for follow-up on benign position vertigo.  Now only suffering with dizziness upon awakening every morning.  Now only occurs when turning to left.  Duration of dizziness every morning for less than one minute.  Performing Eply maneuver exercises every morning.  Cancelled appointment with ENT; had improved. Last visit four weeks ago. 40% improved.  Finished medication five days ago.  Had been taking Meclizine once daily every morning. +nasal congestion; no allergy medication; daughter plans to buy Zyrtec.  Eyes are red; faces itching.    Elevated LFTs: present on recent labs; presenting for repeat labs.  Taking Tylenol 2-3 times per day; has cold a few days ago.     Review of Systems  Constitutional: Negative for fever, chills, diaphoresis, activity change, appetite change and fatigue.  HENT: Positive for congestion and rhinorrhea. Negative for ear pain, sinus pressure, sore throat, trouble swallowing and voice change.   Eyes: Positive for visual disturbance. Negative for photophobia.  Respiratory: Negative for cough and shortness of breath.   Cardiovascular: Negative for chest pain, palpitations and leg swelling.  Gastrointestinal: Negative for nausea, vomiting, abdominal pain and diarrhea.  Endocrine: Negative for cold intolerance, heat intolerance, polydipsia, polyphagia and polyuria.  Skin: Negative for color change, rash and wound.  Neurological: Positive for dizziness. Negative for tremors, seizures, syncope, facial asymmetry, speech difficulty, weakness, light-headedness, numbness and headaches.  Psychiatric/Behavioral: Negative for sleep disturbance and dysphoric mood. The patient is not nervous/anxious.     Past Medical History  Diagnosis Date  . Neuromuscular disorder (HCC)   . Allergy    History reviewed. No pertinent  past surgical history. No Known Allergies  Social History   Social History  . Marital Status: Married    Spouse Name: N/A  . Number of Children: N/A  . Years of Education: N/A   Occupational History  . Not on file.   Social History Main Topics  . Smoking status: Never Smoker   . Smokeless tobacco: Not on file  . Alcohol Use: No  . Drug Use: No  . Sexual Activity: Yes   Other Topics Concern  . Not on file   Social History Narrative   History reviewed. No pertinent family history.     Objective:    BP 134/82 mmHg  Pulse 60  Temp(Src) 98.2 F (36.8 C) (Oral)  Resp 18  Wt 163 lb (73.936 kg)  SpO2 99% Physical Exam  Constitutional: He is oriented to person, place, and time. He appears well-developed and well-nourished. No distress.  HENT:  Head: Normocephalic and atraumatic.  Right Ear: External ear and ear canal normal. Tympanic membrane is retracted.  Left Ear: External ear and ear canal normal. Tympanic membrane is retracted.  Nose: Nose normal.  Mouth/Throat: Uvula is midline, oropharynx is clear and moist and mucous membranes are normal.  Eyes: Conjunctivae and EOM are normal. Pupils are equal, round, and reactive to light.  Neck: Normal range of motion. Neck supple. Carotid bruit is not present. No thyromegaly present.  Cardiovascular: Normal rate, regular rhythm, normal heart sounds and intact distal pulses.  Exam reveals no gallop and no friction rub.   No murmur heard. Pulmonary/Chest: Effort normal and breath sounds normal. He has no wheezes. He has no rales.  Abdominal: Soft. Bowel sounds are normal. He exhibits no distension  and no mass. There is no tenderness. There is no rebound and no guarding.  Lymphadenopathy:    He has no cervical adenopathy.  Neurological: He is alert and oriented to person, place, and time. He has normal strength. No cranial nerve deficit. He exhibits normal muscle tone. He displays a negative Romberg sign. Coordination normal.    Skin: Skin is warm and dry. No rash noted. He is not diaphoretic.  Psychiatric: He has a normal mood and affect. His behavior is normal.  Nursing note and vitals reviewed.  Results for orders placed or performed in visit on 12/10/15  Comprehensive metabolic panel  Result Value Ref Range   Sodium 140 135 - 146 mmol/L   Potassium 4.4 3.5 - 5.3 mmol/L   Chloride 104 98 - 110 mmol/L   CO2 24 20 - 31 mmol/L   Glucose, Bld 85 65 - 99 mg/dL   BUN 16 7 - 25 mg/dL   Creat 9.60 4.54 - 0.98 mg/dL   Total Bilirubin 0.5 0.2 - 1.2 mg/dL   Alkaline Phosphatase 70 40 - 115 U/L   AST 66 (H) 10 - 35 U/L   ALT 92 (H) 9 - 46 U/L   Total Protein 7.4 6.1 - 8.1 g/dL   Albumin 4.3 3.6 - 5.1 g/dL   Calcium 9.2 8.6 - 11.9 mg/dL  TSH  Result Value Ref Range   TSH 1.91 0.40 - 4.50 mIU/L  POCT CBC  Result Value Ref Range   WBC 7.1 4.6 - 10.2 K/uL   Lymph, poc 1.8 0.6 - 3.4   POC LYMPH PERCENT 25.2 10 - 50 %L   MID (cbc) 0.6 0 - 0.9   POC MID % 8.4 0 - 12 %M   POC Granulocyte 4.7 2 - 6.9   Granulocyte percent 66.4 37 - 80 %G   RBC 4.81 4.69 - 6.13 M/uL   Hemoglobin 15.8 14.1 - 18.1 g/dL   HCT, POC 14.7 (A) 82.9 - 53.7 %   MCV 89.9 80 - 97 fL   MCH, POC 32.9 (A) 27 - 31.2 pg   MCHC 36.6 (A) 31.8 - 35.4 g/dL   RDW, POC 56.2 %   Platelet Count, POC 160 142 - 424 K/uL   MPV 6.7 0 - 99.8 fL  POCT glucose (manual entry)  Result Value Ref Range   POC Glucose 96 70 - 99 mg/dl  POCT urinalysis dipstick  Result Value Ref Range   Color, UA yellow yellow   Clarity, UA clear clear   Glucose, UA negative negative   Bilirubin, UA negative negative   Ketones, POC UA negative negative   Spec Grav, UA 1.020    Blood, UA trace-intact (A) negative   pH, UA 5.0    Protein Ur, POC negative negative   Urobilinogen, UA 0.2    Nitrite, UA Negative Negative   Leukocytes, UA Negative Negative   EKG: sinus brady.    Assessment & Plan:   1. Benign paroxysmal positional vertigo, left   2. Elevated LFTs   3.  EKG abnormalities   4. Seasonal allergic rhinitis due to pollen    -improving but persistent; dizziness now occurring every morning upon awakening; rx for Meclizine provided; continue to perform Eply maneuvers daily upon awakening.  If persists at follow-up visit, will re-refer to ENT. -allergic rhinitis: uncontrolled; start Zyrtec  daily. -Elevated LFTs: New; repeat today; obtain hepatitis panel. -EKG abnormalities: improved/stable today.  Asymptomatic.   Orders Placed This Encounter  Procedures  .  Comprehensive metabolic panel  . Acute Hep Panel & Hep B Surface Ab  . EKG 12-Lead   Meds ordered this encounter  Medications  . meclizine (ANTIVERT) 12.5 MG tablet    Sig: Take 1 tablet (12.5 mg total) by mouth 2 (two) times daily.    Dispense:  30 tablet    Refill:  0    No Follow-up on file.    Kristi Paulita FujitaMartin Smith, M.D. [facility]  [facility] [address] [place], [place]  [id number] [phone] phone [phone] fax

## 2016-01-19 ENCOUNTER — Other Ambulatory Visit: Payer: Self-pay

## 2016-01-19 DIAGNOSIS — R42 Dizziness and giddiness: Secondary | ICD-10-CM

## 2016-01-31 ENCOUNTER — Other Ambulatory Visit: Payer: Self-pay | Admitting: Otolaryngology

## 2016-01-31 DIAGNOSIS — R42 Dizziness and giddiness: Secondary | ICD-10-CM

## 2016-02-12 ENCOUNTER — Emergency Department (HOSPITAL_COMMUNITY)
Admission: EM | Admit: 2016-02-12 | Discharge: 2016-02-12 | Disposition: A | Discharge status: Home / Self Care | Payer: BLUE CROSS/BLUE SHIELD | Attending: Emergency Medicine | Admitting: Emergency Medicine

## 2016-02-12 ENCOUNTER — Ambulatory Visit (INDEPENDENT_AMBULATORY_CARE_PROVIDER_SITE_OTHER): Payer: BLUE CROSS/BLUE SHIELD | Admitting: Emergency Medicine

## 2016-02-12 ENCOUNTER — Encounter (HOSPITAL_COMMUNITY): Payer: Self-pay | Admitting: Emergency Medicine

## 2016-02-12 ENCOUNTER — Emergency Department (HOSPITAL_COMMUNITY): Payer: BLUE CROSS/BLUE SHIELD

## 2016-02-12 ENCOUNTER — Ambulatory Visit
Admission: RE | Admit: 2016-02-12 | Discharge: 2016-02-12 | Disposition: A | Discharge status: Home / Self Care | Payer: BLUE CROSS/BLUE SHIELD | Source: Ambulatory Visit | Attending: Otolaryngology | Admitting: Otolaryngology

## 2016-02-12 VITALS — BP 120/70 | HR 98 | Temp 99.9°F | Resp 20

## 2016-02-12 DIAGNOSIS — T7840XA Allergy, unspecified, initial encounter: Secondary | ICD-10-CM | POA: Insufficient documentation

## 2016-02-12 DIAGNOSIS — Y9389 Activity, other specified: Secondary | ICD-10-CM | POA: Insufficient documentation

## 2016-02-12 DIAGNOSIS — T508X5A Adverse effect of diagnostic agents, initial encounter: Secondary | ICD-10-CM | POA: Diagnosis not present

## 2016-02-12 DIAGNOSIS — R509 Fever, unspecified: Secondary | ICD-10-CM | POA: Diagnosis not present

## 2016-02-12 DIAGNOSIS — Y998 Other external cause status: Secondary | ICD-10-CM | POA: Diagnosis not present

## 2016-02-12 DIAGNOSIS — R42 Dizziness and giddiness: Secondary | ICD-10-CM

## 2016-02-12 DIAGNOSIS — Z79899 Other long term (current) drug therapy: Secondary | ICD-10-CM | POA: Insufficient documentation

## 2016-02-12 DIAGNOSIS — Y9289 Other specified places as the place of occurrence of the external cause: Secondary | ICD-10-CM | POA: Diagnosis not present

## 2016-02-12 DIAGNOSIS — X58XXXA Exposure to other specified factors, initial encounter: Secondary | ICD-10-CM | POA: Diagnosis not present

## 2016-02-12 DIAGNOSIS — Z8669 Personal history of other diseases of the nervous system and sense organs: Secondary | ICD-10-CM | POA: Insufficient documentation

## 2016-02-12 DIAGNOSIS — R531 Weakness: Secondary | ICD-10-CM | POA: Diagnosis not present

## 2016-02-12 DIAGNOSIS — M545 Low back pain: Secondary | ICD-10-CM

## 2016-02-12 LAB — CBC WITH DIFFERENTIAL/PLATELET
BASOS ABS: 0 10*3/uL (ref 0.0–0.1)
BASOS PCT: 0 %
Eosinophils Absolute: 0 10*3/uL (ref 0.0–0.7)
Eosinophils Relative: 0 %
HEMATOCRIT: 40 % (ref 39.0–52.0)
HEMOGLOBIN: 13.8 g/dL (ref 13.0–17.0)
Lymphocytes Relative: 3 %
Lymphs Abs: 0.2 10*3/uL — ABNORMAL LOW (ref 0.7–4.0)
MCH: 30.3 pg (ref 26.0–34.0)
MCHC: 34.5 g/dL (ref 30.0–36.0)
MCV: 87.7 fL (ref 78.0–100.0)
MONOS PCT: 3 %
Monocytes Absolute: 0.2 10*3/uL (ref 0.1–1.0)
NEUTROS ABS: 6.3 10*3/uL (ref 1.7–7.7)
NEUTROS PCT: 94 %
Platelets: 124 10*3/uL — ABNORMAL LOW (ref 150–400)
RBC: 4.56 MIL/uL (ref 4.22–5.81)
RDW: 12.9 % (ref 11.5–15.5)
WBC: 6.7 10*3/uL (ref 4.0–10.5)

## 2016-02-12 LAB — URINALYSIS, ROUTINE W REFLEX MICROSCOPIC
Bilirubin Urine: NEGATIVE
GLUCOSE, UA: NEGATIVE mg/dL
Hgb urine dipstick: NEGATIVE
KETONES UR: NEGATIVE mg/dL
LEUKOCYTES UA: NEGATIVE
NITRITE: NEGATIVE
PH: 5 (ref 5.0–8.0)
PROTEIN: NEGATIVE mg/dL
Specific Gravity, Urine: 1.016 (ref 1.005–1.030)

## 2016-02-12 LAB — COMPREHENSIVE METABOLIC PANEL
ALBUMIN: 3.4 g/dL — AB (ref 3.5–5.0)
ALK PHOS: 49 U/L (ref 38–126)
ALT: 32 U/L (ref 17–63)
AST: 43 U/L — AB (ref 15–41)
Anion gap: 7 (ref 5–15)
BILIRUBIN TOTAL: 0.7 mg/dL (ref 0.3–1.2)
BUN: 14 mg/dL (ref 6–20)
CALCIUM: 8.3 mg/dL — AB (ref 8.9–10.3)
CO2: 21 mmol/L — ABNORMAL LOW (ref 22–32)
Chloride: 109 mmol/L (ref 101–111)
Creatinine, Ser: 1.13 mg/dL (ref 0.61–1.24)
GFR calc Af Amer: >60 mL/min (ref >60)
GFR calc non Af Amer: >60 mL/min (ref >60)
GLUCOSE: 115 mg/dL — AB (ref 65–99)
Potassium: 3.4 mmol/L — ABNORMAL LOW (ref 3.5–5.1)
Sodium: 137 mmol/L (ref 135–145)
TOTAL PROTEIN: 6.2 g/dL — AB (ref 6.5–8.1)

## 2016-02-12 LAB — I-STAT TROPONIN, ED: Troponin i, poc: 0 ng/mL (ref 0.00–0.08)

## 2016-02-12 MED ORDER — GADOBENATE DIMEGLUMINE 529 MG/ML IV SOLN
15.0000 mL | Freq: Once | INTRAVENOUS | Status: AC | PRN
  Administered 2016-02-12: 15 mL via INTRAVENOUS

## 2016-02-12 MED ORDER — SODIUM CHLORIDE 0.9 % IV BOLUS (SEPSIS)
1000.0000 mL | Freq: Once | INTRAVENOUS | Status: AC
  Administered 2016-02-12: 1000 mL via INTRAVENOUS

## 2016-02-12 NOTE — Discharge Instructions (Signed)
Drink plenty of fluids and follow-up with your family doctor if Problems

## 2016-02-12 NOTE — Progress Notes (Signed)
Pt brought back to room 2 urgent for SOB, dizziness and lower back pain Started 1 hour after MRI with contrast today No itching or hives noted [place] interpretor present for communication BP 120/74 98 20 96% RA febrile 99.9 Repeat BP 116/78 IV inserted- EMS called

## 2016-02-12 NOTE — ED Provider Notes (Signed)
CSN: [id number]     Arrival date & time 02/12/16  1453 History   First MD Initiated Contact with Patient 02/12/16 1503     Chief Complaint  Patient presents with  . Allergic Reaction     (Consider location/radiation/quality/duration/timing/severity/associated sxs/prior Treatment) Patient is a 55 y.o. male presenting with allergic reaction. The history is provided by a relative, the EMS personnel and the patient (Patient had done MRI with contrast today. Starting to feel poorly with significant anterior care and had a low blood pressure and fever was sent here).  Allergic Reaction Presenting symptoms: no difficulty breathing, no difficulty swallowing and no rash   Presenting symptoms comment:  Weakness Severity:  Moderate Prior allergic episodes:  No prior episodes Context: no animal exposure   Relieved by:  Nothing Worsened by:  Nothing tried   Past Medical History  Diagnosis Date  . Neuromuscular disorder (HCC)   . Allergy    History reviewed. No pertinent past surgical history. History reviewed. No pertinent family history. Social History  Substance Use Topics  . Smoking status: Never Smoker   . Smokeless tobacco: None  . Alcohol Use: No    Review of Systems  Constitutional: Positive for fatigue. Negative for appetite change.  HENT: Negative for congestion, ear discharge, sinus pressure and trouble swallowing.   Eyes: Negative for discharge.  Respiratory: Negative for cough.   Cardiovascular: Negative for chest pain.  Gastrointestinal: Negative for abdominal pain and diarrhea.  Genitourinary: Negative for frequency and hematuria.  Musculoskeletal: Negative for back pain.  Skin: Negative for rash.  Neurological: Negative for seizures and headaches.  Psychiatric/Behavioral: Negative for hallucinations.      Allergies  Gadolinium derivatives  Home Medications   Prior to Admission medications   Medication Sig Start Date End Date Taking? Authorizing Provider   hydrOXYzine (ATARAX/VISTARIL) 25 MG tablet Take 25 mg by mouth daily. 12/05/15  Yes Historical Provider, MD  meclizine (ANTIVERT) 12.5 MG tablet Take 12.5 mg by mouth daily. 01/07/16  Yes Historical Provider, MD  meclizine (ANTIVERT) 12.5 MG tablet Take 1 tablet (12.5 mg total) by mouth 2 (two) times daily. Patient not taking: Reported on 02/12/2016 01/07/16   Ethelda ChickKristi M Smith, MD   BP 116/71 mmHg  Pulse 81  Temp(Src) 98.1 F (36.7 C) (Oral)  Resp 24  Ht 5\' 9"  (1.753 m)  Wt 175 lb (79.379 kg)  BMI 25.83 kg/m2  SpO2 94% Physical Exam  Constitutional: He is oriented to person, place, and time. He appears well-developed.  HENT:  Head: Normocephalic.  Eyes: Conjunctivae and EOM are normal. No scleral icterus.  Neck: Neck supple. No thyromegaly present.  Cardiovascular: Normal rate and regular rhythm.  Exam reveals no gallop and no friction rub.   No murmur heard. Pulmonary/Chest: No stridor. He has no wheezes. He has no rales. He exhibits no tenderness.  Abdominal: He exhibits no distension. There is no tenderness. There is no rebound.  Musculoskeletal: Normal range of motion. He exhibits no edema.  Lymphadenopathy:    He has no cervical adenopathy.  Neurological: He is oriented to person, place, and time. He exhibits normal muscle tone. Coordination normal.  Skin: No rash noted. No erythema.  Psychiatric: He has a normal mood and affect. His behavior is normal.    ED Course  Procedures (including critical care time) Labs Review Labs Reviewed  CBC WITH DIFFERENTIAL/PLATELET - Abnormal; Notable for the following:    Platelets 124 (*)    Lymphs Abs 0.2 (*)    All  other components within normal limits  COMPREHENSIVE METABOLIC PANEL - Abnormal; Notable for the following:    Potassium 3.4 (*)    CO2 21 (*)    Glucose, Bld 115 (*)    Calcium 8.3 (*)    Total Protein 6.2 (*)    Albumin 3.4 (*)    AST 43 (*)    All other components within normal limits  URINALYSIS, ROUTINE W REFLEX  MICROSCOPIC (NOT AT [facility])  Rosezena Sensor, ED    Imaging Review Dg Chest 2 View  02/12/2016  CLINICAL DATA:  Fever. EXAM: CHEST  2 VIEW COMPARISON:  October 29, 2015. FINDINGS: The heart size and mediastinal contours are within normal limits. Both lungs are clear. No pneumothorax or pleural effusion is noted. The visualized skeletal structures are unremarkable. IMPRESSION: No active cardiopulmonary disease. Electronically Signed   By: Lupita Raider, M.D.   On: 02/12/2016 16:12   Mr Laqueta Jean ZO Contrast  02/12/2016  CLINICAL DATA:  Dizziness. No hearing loss. Generalized weakness. Symptoms for 3 months. EXAM: MRI HEAD WITHOUT AND WITH CONTRAST TECHNIQUE: Multiplanar, multiecho pulse sequences of the brain and surrounding structures were obtained without and with intravenous contrast. CONTRAST:  15mL MULTIHANCE GADOBENATE DIMEGLUMINE 529 MG/ML IV SOLN COMPARISON:  None. FINDINGS: No evidence for acute infarction, hemorrhage, mass lesion, hydrocephalus, or extra-axial fluid. Normal for age cerebral volume. Mild to moderate subcortical and periventricular T2 and FLAIR hyperintensities, likely chronic microvascular ischemic change. No midline abnormality. No significant sinus or mastoid disease. Thin-section imaging was obtained through the posterior fossa. No evidence for vestibular schwannoma or labyrinthine enhancement. No extra-axial posterior fossa mass. No evidence for vascular loop. Post infusion imaging through the entire brain was obtained and demonstrates no abnormal enhancement of the brain or meninges. IMPRESSION: Mild to moderate small vessel disease. No acute intracranial findings. No cause is seen for dizziness. No retrocochlear lesion is observed. No vascular occlusion or vascular loop is identified. Electronically Signed   By: Elsie Stain M.D.   On: 02/12/2016 12:08   I have personally reviewed and evaluated these images and lab results as part of my medical decision-making.   EKG  Interpretation None      MDM   Final diagnoses:  Allergic reaction, initial encounter    Patient with allergic reaction to IV dye. Patient stable now and will be discharged home and he will follow-up with his family doctor as needed    Bethann Berkshire, MD 02/12/16 2119

## 2016-02-12 NOTE — ED Notes (Signed)
Ems states pt is being sent over after having a MRI. Pt was hypotensive and had a fever after contrast. Pt was getting a MRI today for the dizziness x 3 months.

## 2016-02-12 NOTE — Progress Notes (Signed)
Chief Complaint:  Chief Complaint  Patient presents with  . Allergic Reaction  . Chills  . Dizziness    HPI: Nicolas Thompson is a 55 y.o. male who reports to [facility] today Presenting emergently with feeling as if he will faint, fever, chills, severe back pain. Patient had an MRI this morning with IV contrast. After arriving home he started to feel these symptoms. He speaks no [place]. History is taken from a friend.  Past Medical History  Diagnosis Date  . Neuromuscular disorder (HCC)   . Allergy    No past surgical history on file. Social History   Social History  . Marital Status: Married    Spouse Name: N/A  . Number of Children: N/A  . Years of Education: N/A   Social History Main Topics  . Smoking status: Never Smoker   . Smokeless tobacco: None  . Alcohol Use: No  . Drug Use: No  . Sexual Activity: Yes   Other Topics Concern  . None   Social History Narrative   No family history on file. No Known Allergies Prior to Admission medications   Medication Sig Start Date End Date Taking? Authorizing Provider  meclizine (ANTIVERT) 12.5 MG tablet Take 1 tablet (12.5 mg total) by mouth 2 (two) times daily. 01/07/16   Ethelda Chick, MD     ROS:     PHYSICAL EXAM: Filed Vitals:   02/12/16 1406  BP: 120/70  Pulse: 98  Temp: 99.9 F (37.7 C)  Resp: 20   There is no weight on file to calculate BMI.   General:Patient in acute distress writhing around in the bed moaning when touching his back or abdomen. HEENT:  Normocephalic, atraumatic, oropharynx patent. Eye: Nicolas Thompson Cardiovascular:  Regular rate and rhythm, no rubs murmurs or gallops.  No Carotid bruits, radial pulse intact. No pedal edema.  Respiratory: Clear to auscultation bilaterally.  No wheezes, rales, or rhonchi.  No cyanosis, no use of accessory musculature Abdominal: No organomegaly, abdomen is soft and non-tender, positive bowel sounds.  No masses. Musculoskeletal: Gait intact. No edema,  tenderness Skin: No rashes. Neurologic: Facial musculature symmetric. Psychiatric: Patient acts appropriately throughout our interaction. Lymphatic: No cervical or submandibular lymphadenopathy    LABS: Results for orders placed or performed in visit on 01/07/16  Comprehensive metabolic panel  Result Value Ref Range   Sodium 140 135 - 146 mmol/L   Potassium 4.0 3.5 - 5.3 mmol/L   Chloride 104 98 - 110 mmol/L   CO2 26 20 - 31 mmol/L   Glucose, Bld 82 65 - 99 mg/dL   BUN 17 7 - 25 mg/dL   Creat 7.84 6.96 - 2.95 mg/dL   Total Bilirubin 0.4 0.2 - 1.2 mg/dL   Alkaline Phosphatase 50 40 - 115 U/L   AST 26 10 - 35 U/L   ALT 36 9 - 46 U/L   Total Protein 6.9 6.1 - 8.1 g/dL   Albumin 4.2 3.6 - 5.1 g/dL   Calcium 8.8 8.6 - 28.4 mg/dL  Acute Hep Panel & Hep B Surface Ab  Result Value Ref Range   Hepatitis B Surface Ag NEGATIVE NEGATIVE   Hep B C IgM NON REACTIVE NON REACTIVE   Hep B S Ab POS (A) NEGATIVE   Hep A IgM NON REACTIVE NON REACTIVE   HCV Ab NEGATIVE NEGATIVE     EKG/XRAY:   Primary read interpreted by Dr. Cleta Alberts at [facility].   ASSESSMENT/PLAN: Patient appears to be  having some type of febrile reaction to the IV contrast administered at the time of his MRI. He presents very similar to his transfusion type reaction. He is only on meclizine for medications. He was not administered any medications here in the office. He was placed on a monitor IV fluids started and EMS was called. I did check one blood pressure which was low at 80/60 but on repeat by EMS palpable blood pressure was over 90. Patient was alert during the entire interaction. Glucose as stated was checked and was normal. EMS arrived and will transport to the hospital.   Gross sideeffects, risk and benefits, and alternatives of medications d/w patient. Patient is aware that all medications have potential sideeffects and we are unable to predict every sideeffect or drug-drug interaction that may occur.  [name]  MD 02/12/2016 2:46 PM

## 2016-02-12 NOTE — ED Notes (Signed)
Patient placed in gown and on monitor; EKG performed

## 2016-02-12 NOTE — ED Notes (Signed)
Patient physically moved to room A7 from hallway with family in tow; patient placed on continuous pulse oximetry and blood pressure cuff

## 2017-02-01 NOTE — Telephone Encounter (Signed)
No note needed 

## 2017-05-27 IMAGING — CR DG CHEST 2V
2 series · 2 of 2 positions shown · non-contrast
Comparison: 02/07/2015

CLINICAL DATA: Acute fever, cough and chest tightness for 2 days

EXAM:
CHEST  2 VIEW

[PA]
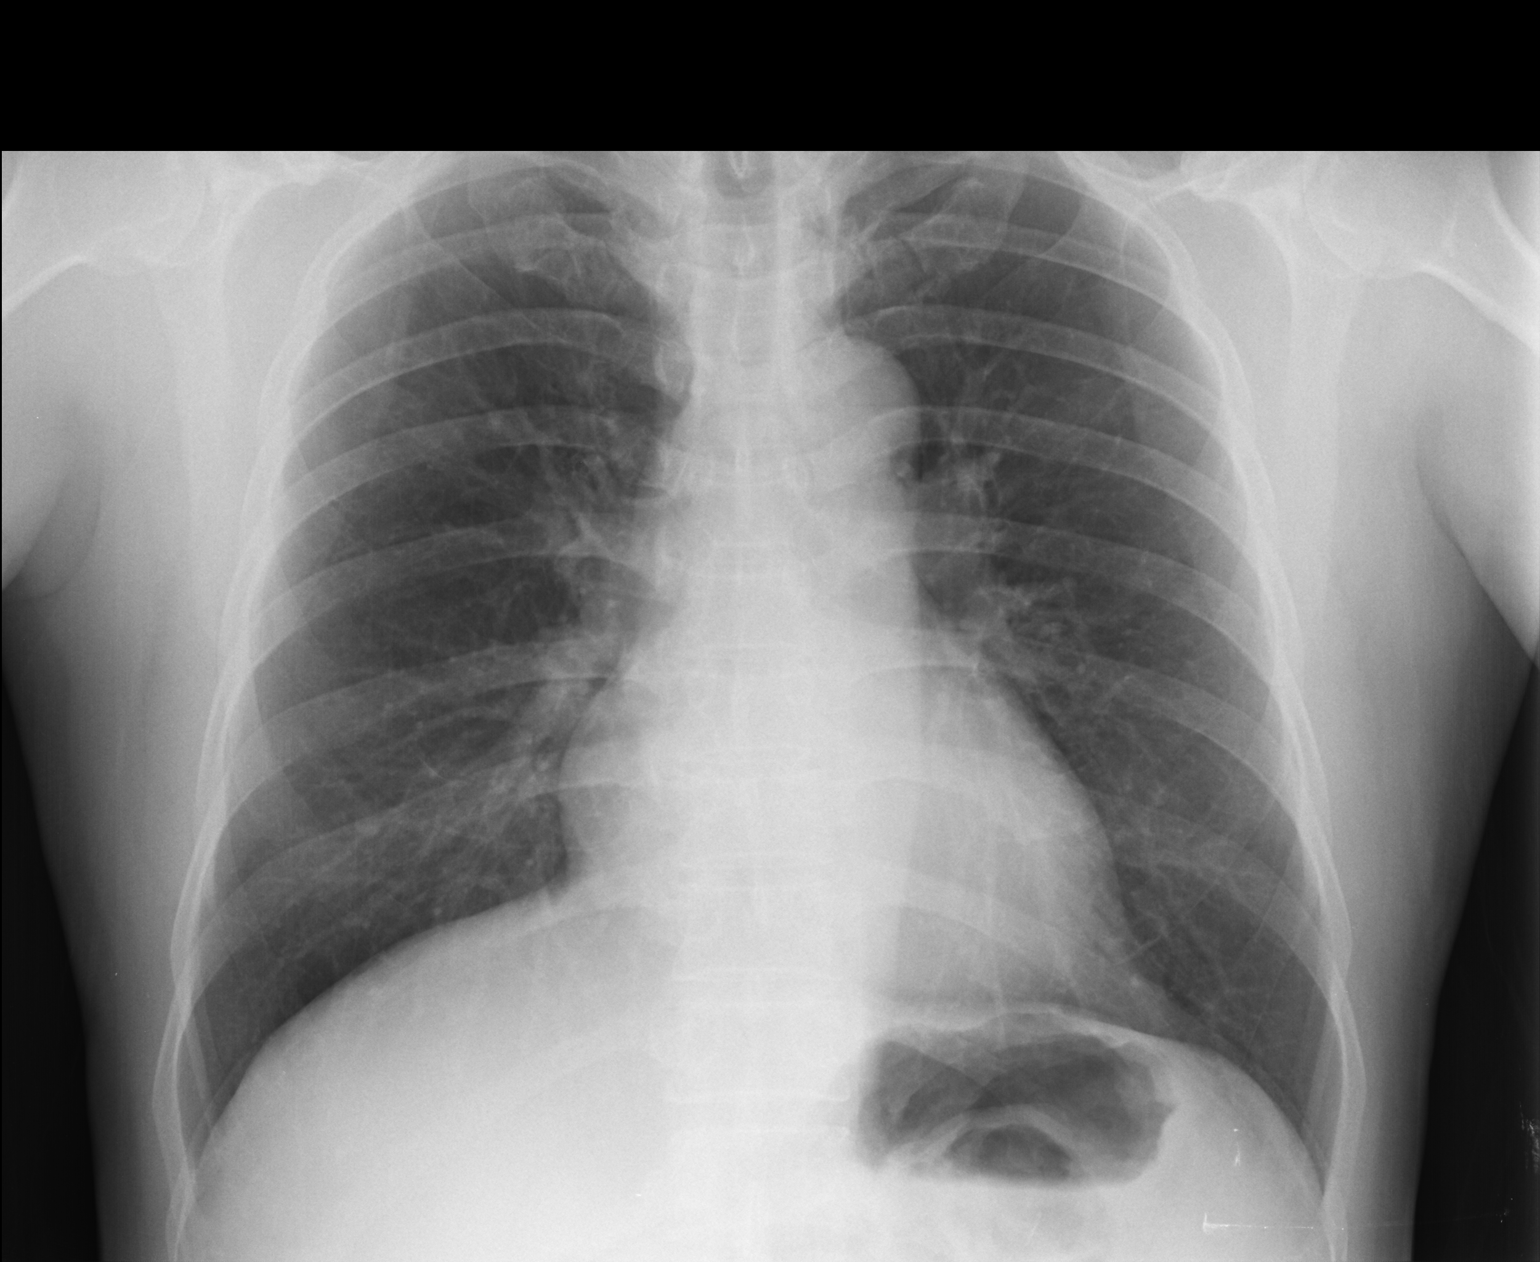

[lateral]
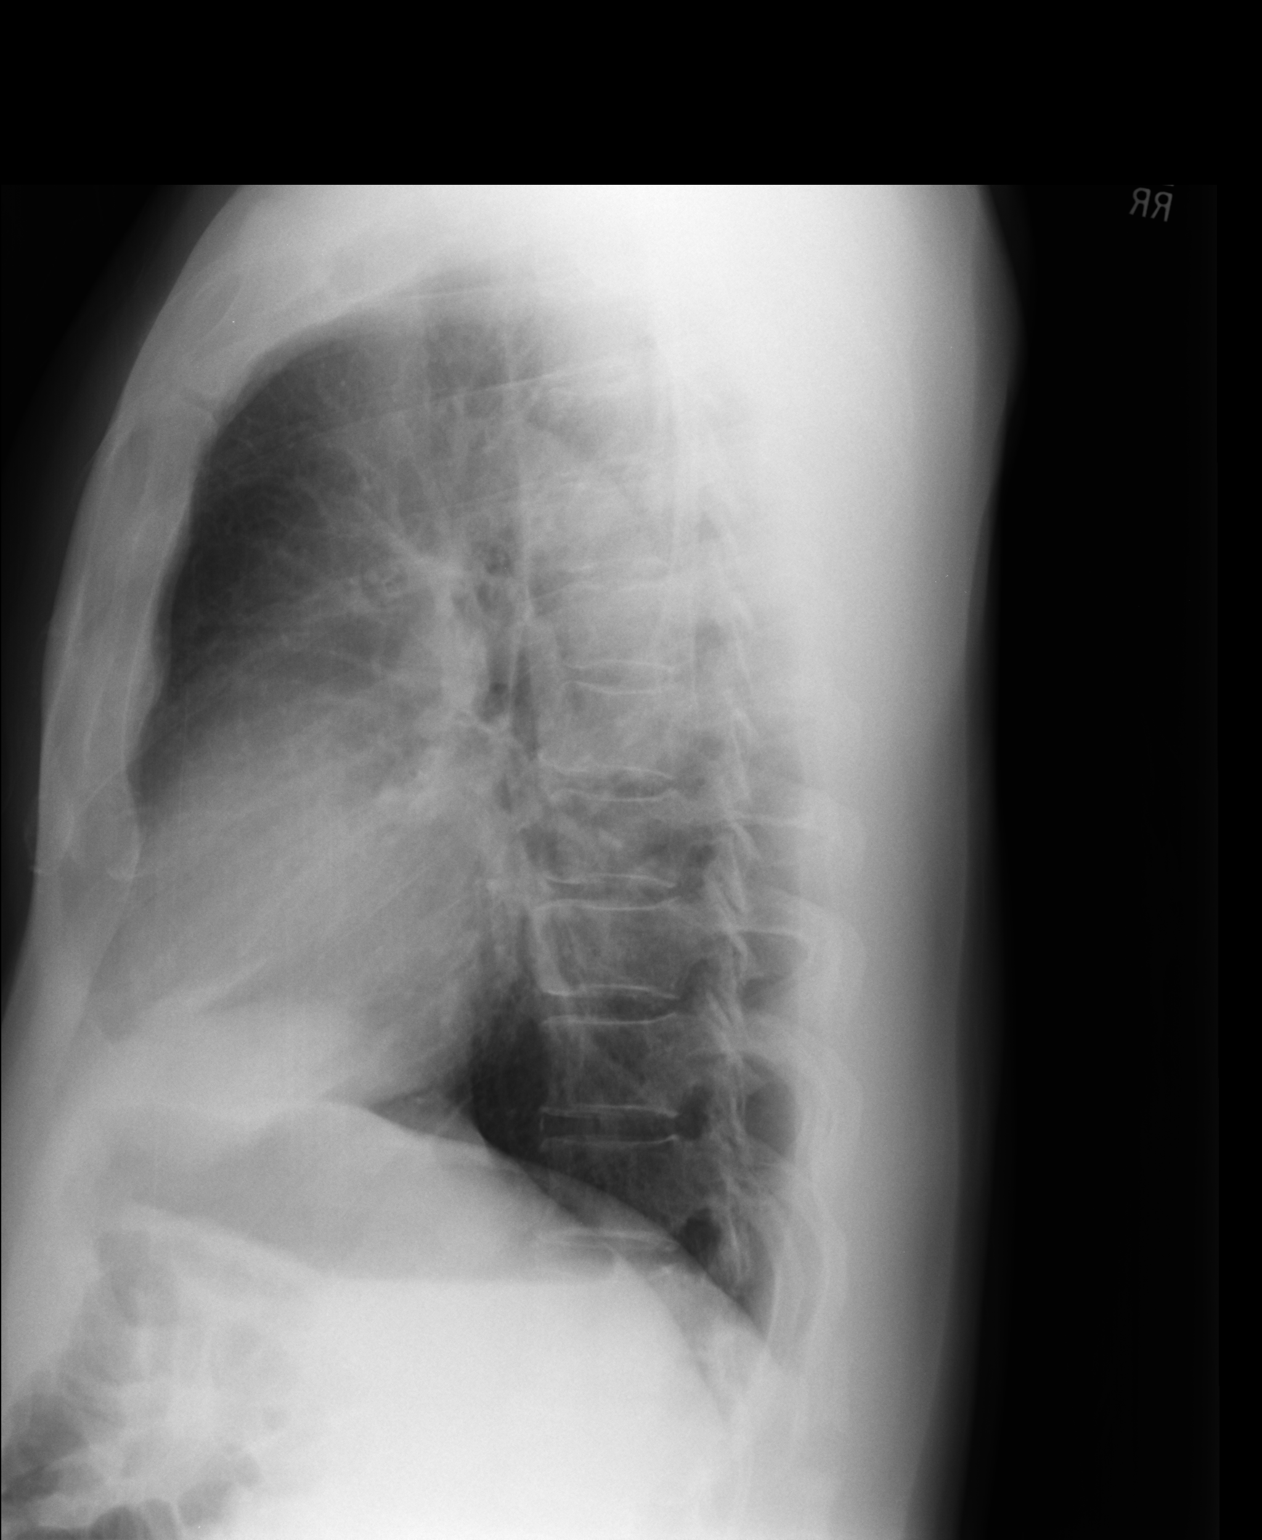

[2 of 2 positions shown; findings below may reference images not displayed]

FINDINGS: The heart size and mediastinal contours are within normal limits.
Both lungs are clear. The visualized skeletal structures are
unremarkable.
IMPRESSION: No active cardiopulmonary disease.

## 2018-04-23 ENCOUNTER — Ambulatory Visit (INDEPENDENT_AMBULATORY_CARE_PROVIDER_SITE_OTHER): Payer: BLUE CROSS/BLUE SHIELD | Admitting: Family Medicine

## 2018-04-23 VITALS — BP 134/76 | HR 66 | Temp 98.0°F | Resp 16 | Ht 65.87 in | Wt 167.8 lb

## 2018-04-23 DIAGNOSIS — I10 Essential (primary) hypertension: Secondary | ICD-10-CM | POA: Diagnosis not present

## 2018-04-23 DIAGNOSIS — R238 Other skin changes: Secondary | ICD-10-CM

## 2018-04-23 DIAGNOSIS — T63461A Toxic effect of venom of wasps, accidental (unintentional), initial encounter: Secondary | ICD-10-CM

## 2018-04-23 MED ORDER — TRIAMCINOLONE ACETONIDE 0.1 % EX CREA: 1.0000 "application " | TOPICAL_CREAM | Freq: Two times a day (BID) | CUTANEOUS | 0 refills | Status: DC

## 2018-04-23 MED ORDER — LOSARTAN POTASSIUM 25 MG PO TABS: 25.0000 mg | ORAL_TABLET | Freq: Every day | ORAL | 1 refills | Status: DC

## 2018-04-23 NOTE — Progress Notes (Signed)
Chief Complaint  Patient presents with  . Insect Bite    bitten by bug  on his neck and bees on head and arms about a month ago     HPI  He was working in the yard when he got stung by bees or wasps from the ground  Reports that he has been stung multiple times on the head, the forearm and the back of the neck He has pain and numbness at the back of the neck He denies fevers or chills No hives or sore throat First stung a month ago   Hypertension Pt has a medication from back in [place] He states that he would like to get a refill of the medication losartan He takes it daily He has never gotten it prescribed here BP Readings from Last 3 Encounters:  04/23/18 134/76  02/12/16 120/73  02/12/16 120/70    Past Medical History:  Diagnosis Date  . Allergy   . Neuromuscular disorder [facility])     Current Outpatient Medications  Medication Sig Dispense Refill  . ampicillin (PRINCIPEN) 500 MG capsule Take 500 mg by mouth 2 (two) times daily.    Marland Kitchen losartan (COZAAR) 25 MG tablet Take 1 tablet (25 mg total) by mouth daily. 90 tablet 1  . hydrOXYzine (ATARAX/VISTARIL) 25 MG tablet Take 25 mg by mouth daily.    . meclizine (ANTIVERT) 12.5 MG tablet Take 1 tablet (12.5 mg total) by mouth 2 (two) times daily. (Patient not taking: Reported on 02/12/2016) 30 tablet 0  . meclizine (ANTIVERT) 12.5 MG tablet Take 12.5 mg by mouth daily.    Marland Kitchen triamcinolone cream (KENALOG) 0.1 % Apply 1 application topically 2 (two) times daily. Only use for 2 weeks at a time 30 g 0   No current facility-administered medications for this visit.     Allergies:  Allergies  Allergen Reactions  . Gadolinium Derivatives     No past surgical history on file.  Social History   Socioeconomic History  . Marital status: Married    Spouse name: Not on file  . Number of children: Not on file  . Years of education: Not on file  . Highest education level: Not on file  Occupational History  . Not on file    Social Needs  . Financial resource strain: Not on file  . Food insecurity:    Worry: Not on file    Inability: Not on file  . Transportation needs:    Medical: Not on file    Non-medical: Not on file  Tobacco Use  . Smoking status: Never Smoker  Substance and Sexual Activity  . Alcohol use: No    Alcohol/week: 0.0 standard drinks  . Drug use: No  . Sexual activity: Yes  Lifestyle  . Physical activity:    Days per week: Not on file    Minutes per session: Not on file  . Stress: Not on file  Relationships  . Social connections:    Talks on phone: Not on file    Gets together: Not on file    Attends religious service: Not on file    Active member of club or organization: Not on file    Attends meetings of clubs or organizations: Not on file    Relationship status: Not on file  Other Topics Concern  . Not on file  Social History Narrative  . Not on file    No family history on file.   ROS Review of Systems See HPI Constitution:  No fevers or chills No malaise No diaphoresis Skin: No rash or itching Eyes: no blurry vision, no double vision GU: no dysuria or hematuria Neuro: no dizziness or headaches all others reviewed and negative   Objective: Vitals:   04/23/18 1644  BP: 134/76  Pulse: 66  Resp: 16  Temp: 98 F (36.7 C)  SpO2: 96%  Weight: 167 lb 12.8 oz (76.1 kg)  Height: 5' 5.87" (1.673 m)    Physical Exam  Constitutional: He is oriented to person, place, and time. He appears well-developed and well-nourished.  HENT:  Head: Normocephalic and atraumatic.  Musculoskeletal:       Cervical back: He exhibits swelling. He exhibits normal range of motion, no tenderness, no bony tenderness, no edema, no deformity, no laceration, no pain, no spasm and normal pulse.  Neurological: He is alert and oriented to person, place, and time. He has normal strength. He displays no atrophy and no tremor. He exhibits normal muscle tone.    Assessment and Plan Eda PaschalCong  was seen today for insect bite.  Diagnoses and all orders for this visit:  Skin irritation- will try triamcinolone -     triamcinolone cream (KENALOG) 0.1 %; Apply 1 application topically 2 (two) times daily. Only use for 2 weeks at a time  Wasp sting, accidental or unintentional, initial encounter -  Supportive care  Essential hypertension- Patient's blood pressure is at goal of 139/89 or less. Condition is stable. Continue current medications and treatment plan. I recommend that you exercise for 30-45 minutes 5 days a week. I also recommend a balanced diet with fruits and vegetables every day, lean meats, and little fried foods. The DASH diet (you can find this online) is a good example of this.  -     Basic metabolic panel -     losartan (COZAAR) 25 MG tablet; Take 1 tablet (25 mg total) by mouth daily.     Yesly Gerety A Christi Wirick

## 2018-04-23 NOTE — Patient Instructions (Addendum)
   IF you received an x-ray today, you will receive an invoice from [facility]. Please contact [facility] at [phone] with questions or concerns regarding your invoice.   IF you received labwork today, you will receive an invoice from [facility]. Please contact [facility] at [phone] with questions or concerns regarding your invoice.   Our billing staff will not be able to assist you with questions regarding bills from these companies.  You will be contacted with the lab results as soon as they are available. The fastest way to get your results is to activate your My Chart account. Instructions are located on the last page of this paperwork. If you have not heard from us regarding the results in 2 weeks, please contact this office.      Bee, Wasp, or Hornet Sting, Adult Bees, wasps, and hornets are part of a family of insects that can sting people. These stings can cause pain and inflammation, but they are usually not serious. However, some people may have an allergic reaction to a sting. This can cause the symptoms to be more severe. What increases the risk? You may be at a greater risk of getting stung if you:  Provoke a stinging insect by swatting or disturbing it.  Wear strong-smelling soaps, deodorants, or body sprays.  Spend time outdoors near gardens with flowers or fruit trees or in clothes that expose skin.  Eat or drink outside.  What are the signs or symptoms? Common symptoms of this condition include:  A red lump in the skin that sometimes has a tiny hole in the center. In some cases, a stinger may be in the center of the wound.  Pain and itching at the sting site.  Redness and swelling around the sting site. If you have an allergic reaction (localized allergic reaction), the swelling and redness may spread out from the sting site. In some cases, this reaction can continue to develop over the next 24-48 hours.  In rare cases, a person may have  a severe allergic reaction (anaphylactic reaction) to a sting. Symptoms of an anaphylactic reaction may include:  Wheezing or difficulty breathing.  Raised, itchy, red patches on the skin (hives).  Nausea or vomiting.  Abdominal cramping.  Diarrhea.  Tightness in the chest or chest pain.  Dizziness or fainting.  Redness of the face (flushing).  Hoarse voice.  Swollen tongue, lips, or face.  How is this diagnosed? This condition is usually diagnosed based on your symptoms and medical history as well as a physical exam. You may have an allergy test to determine if you are allergic to the substance that the insect injected during the sting (venom). How is this treated? If you were stung by a bee, the stinger and a small sac of venom may be in the wound. It is important to remove the stinger as soon as possible. You can do this by brushing across the wound with gauze, a fingernail, or a flat card such as a credit card. Removing the stinger can help reduce the severity of your body's reaction to the sting. Most stings can be treated with:  Icing to reduce swelling in the area.  Medicines (antihistamines) to treat itching or an allergic reaction.  Medicines to help reduce pain. These may be medicines that you take by mouth, or medicated creams or lotions that you apply to your skin.  Pay close attention to your symptoms after you have been stung. If possible, have someone stay with you   to make sure you do not have an allergic reaction. If you have any signs of an allergic reaction, call your health care provider. If you have ever had a severe allergic reaction, your health care provider may give you an inhaler or injectable medicine (epinephrine auto-injector) to use if necessary. Follow these instructions at home:  Wash the sting site 2-3 times each day with soap and water as told by your health care provider.  Apply or take over-the-counter and prescription medicines only as told  by your health care provider.  If directed, apply ice to the sting area. ? Put ice in a plastic bag. ? Place a towel between your skin and the bag. ? Leave the ice on for 20 minutes, 2-3 times a day.  Do not scratch the sting area.  If you had a severe allergic reaction to a sting, you may need: ? To wear a medical bracelet or necklace that lists the allergy. ? To learn when and how to use an anaphylaxis kit or epinephrine injection. Your family members and coworkers may also need to learn this. ? To carry an anaphylaxis kit or epinephrine injection with you at all times. How is this prevented?  Avoid swatting at stinging insects and disturbing insect nests.  Do not use fragrant soaps or lotions.  Wear shoes, pants, and long sleeves when spending time outdoors, especially in grassy areas where stinging insects are common.  Keep outdoor areas free from nests or hives.  Keep food and drink containers covered when eating outdoors.  Avoid working or sitting near flowering plants, if possible.  Wear gloves if you are gardening or working outdoors.  If an attack by a stinging insect or a swarm seems likely in the moment, move away from the area or find a barrier between you and the insect(s), such as a door. Contact a health care provider if:  Your symptoms do not get better in 2-3 days.  You have redness, swelling, or pain that spreads beyond the area of the sting.  You have a fever. Get help right away if: You have symptoms of a severe allergic reaction. These include:  Wheezing or difficulty breathing.  Tightness in the chest or chest pain.  Light-headedness or fainting.  Itchy, raised, red patches on the skin.  Nausea or vomiting.  Abdominal cramping.  Diarrhea.  A swollen tongue or lips, or trouble swallowing.  Dizziness or fainting.  Summary  Stings from bees, wasps, and hornets can cause pain and inflammation, but they are usually not serious. However,  some people may have an allergic reaction to a sting. This can cause the symptoms to be more severe.  Pay close attention to your symptoms after you have been stung. If possible, have someone stay with you to make sure you do not have an allergic reaction.  Call your health care provider if you have any signs of an allergic reaction. This information is not intended to replace advice given to you by your health care provider. Make sure you discuss any questions you have with your health care provider. Document Released: 09/01/2005 Document Revised: 11/06/2016 Document Reviewed: 11/06/2016 Elsevier Interactive Patient Education  2018 Elsevier Inc.  

## 2018-04-24 LAB — BASIC METABOLIC PANEL
BUN/Creatinine Ratio: 13 (ref 9–20)
BUN: 15 mg/dL (ref 6–24)
CO2: 20 mmol/L (ref 20–29)
Calcium: 9.6 mg/dL (ref 8.7–10.2)
Chloride: 104 mmol/L (ref 96–106)
Creatinine, Ser: 1.13 mg/dL (ref 0.76–1.27)
GFR calc non Af Amer: 71 mL/min/{1.73_m2} (ref >59)
GFR, EST AFRICAN AMERICAN: 82 mL/min/{1.73_m2} (ref >59)
Glucose: 125 mg/dL — ABNORMAL HIGH (ref 65–99)
Potassium: 3.7 mmol/L (ref 3.5–5.2)
SODIUM: 144 mmol/L (ref 134–144)

## 2018-04-25 ENCOUNTER — Encounter: Payer: Self-pay | Admitting: Family Medicine

## 2018-04-25 DIAGNOSIS — I10 Essential (primary) hypertension: Secondary | ICD-10-CM | POA: Insufficient documentation

## 2018-10-09 ENCOUNTER — Encounter (HOSPITAL_COMMUNITY): Payer: Self-pay

## 2018-10-09 ENCOUNTER — Ambulatory Visit (HOSPITAL_COMMUNITY)
Admission: EM | Admit: 2018-10-09 | Discharge: 2018-10-09 | Discharge status: Against Medical Advice | Payer: BLUE CROSS/BLUE SHIELD | Source: Home / Self Care

## 2018-10-09 ENCOUNTER — Emergency Department (HOSPITAL_COMMUNITY): Payer: BLUE CROSS/BLUE SHIELD

## 2018-10-09 ENCOUNTER — Other Ambulatory Visit: Payer: Self-pay

## 2018-10-09 ENCOUNTER — Emergency Department (HOSPITAL_COMMUNITY)
Admission: EM | Admit: 2018-10-09 | Discharge: 2018-10-09 | Disposition: A | Discharge status: Home / Self Care | Payer: BLUE CROSS/BLUE SHIELD | Attending: Emergency Medicine | Admitting: Emergency Medicine

## 2018-10-09 DIAGNOSIS — Z79899 Other long term (current) drug therapy: Secondary | ICD-10-CM | POA: Insufficient documentation

## 2018-10-09 DIAGNOSIS — I10 Essential (primary) hypertension: Secondary | ICD-10-CM | POA: Diagnosis not present

## 2018-10-09 DIAGNOSIS — R509 Fever, unspecified: Secondary | ICD-10-CM | POA: Diagnosis present

## 2018-10-09 DIAGNOSIS — J40 Bronchitis, not specified as acute or chronic: Secondary | ICD-10-CM

## 2018-10-09 DIAGNOSIS — R6889 Other general symptoms and signs: Secondary | ICD-10-CM

## 2018-10-09 HISTORY — DX: Essential (primary) hypertension: I10

## 2018-10-09 MED ORDER — AZITHROMYCIN 250 MG PO TABS: ORAL_TABLET | ORAL | 0 refills | Status: DC

## 2018-10-09 NOTE — ED Triage Notes (Signed)
Pt reports 4 days of productive cough, fever, and generalized body aches. Pain in hips when he coughs. Denies recent travel. No OTC medications today.

## 2018-10-09 NOTE — Discharge Instructions (Signed)
Continue to stay well-hydrated. Gargle warm salt water and spit it out and use chloraseptic spray as needed for sore throat. Continue to alternate between Tylenol and Ibuprofen for pain or fever. Use Mucinex/Robitussin/etc for cough suppression/expectoration of mucus. Use over the counter flonase and the netipot to help with nasal congestion. May consider over-the-counter Benadryl or other antihistamine like Claritin/Zyrtec/etc to decrease secretions and for help with your symptoms. Take antibiotic as directed until completed. Follow up with your primary care doctor in 5-7 days for recheck of ongoing symptoms. Return to emergency department for emergent changing or worsening of symptoms.

## 2018-10-09 NOTE — ED Provider Notes (Signed)
[facility] EMERGENCY DEPARTMENT Provider Note   CSN: [id number] Arrival date & time: 10/09/18  1142     History   Chief Complaint Chief Complaint  Patient presents with  . Fever  . Generalized Body Aches  . Cough    HPI Nicolas Thompson is a 58 y.o. male with a PMHx of HTN, who presents to the ED with complaints of flulike symptoms for the last 4 days.  Patient symptoms include chills, subjective fever, cough with whitish-brownish sputum production, body aches, fatigue, and rhinorrhea.  He has tried Tylenol with some relief.  Being around "dirt" or being outside seems to worsen his symptoms.  There are multiple members of his family who are sick with similar symptoms.  He denies any recent travel, he is a non-smoker.  He denies any sore throat, ear pain or drainage, chest pain, shortness of breath, abdominal pain, nausea, vomiting, diarrhea, constipation, dysuria, hematuria, focal arthralgias, numbness, tingling, focal weakness, rashes, or any other complaints at this time.  The history is provided by the patient and medical records. A language interpreter was used Investment banker, corporate).  Fever  Associated symptoms: chills, cough, myalgias and rhinorrhea   Associated symptoms: no chest pain, no confusion, no diarrhea, no dysuria, no ear pain, no nausea, no rash, no sore throat and no vomiting   Cough  Associated symptoms: chills, fever, myalgias and rhinorrhea   Associated symptoms: no chest pain, no ear pain, no rash, no shortness of breath and no sore throat     Past Medical History:  Diagnosis Date  . Allergy   . Hypertension   . Neuromuscular disorder [facility])     Patient Active Problem List   Diagnosis Date Noted  . Essential hypertension 04/25/2018    History reviewed. No pertinent surgical history.      Home Medications    Prior to Admission medications   Medication Sig Start Date End Date Taking? Authorizing Provider  ampicillin (PRINCIPEN)  500 MG capsule Take 500 mg by mouth 2 (two) times daily.    [provider]  hydrOXYzine (ATARAX/VISTARIL) 25 MG tablet Take 25 mg by mouth daily. 12/05/15   [provider]  losartan (COZAAR) 25 MG tablet Take 1 tablet (25 mg total) by mouth daily. 04/23/18   Doristine Bosworth, MD  meclizine (ANTIVERT) 12.5 MG tablet Take 1 tablet (12.5 mg total) by mouth 2 (two) times daily. Patient not taking: Reported on 02/12/2016 01/07/16   Ethelda Chick, MD  meclizine (ANTIVERT) 12.5 MG tablet Take 12.5 mg by mouth daily. 01/07/16   [provider]  triamcinolone cream (KENALOG) 0.1 % Apply 1 application topically 2 (two) times daily. Only use for 2 weeks at a time 04/23/18   Doristine Bosworth, MD    Family History No family history on file.  Social History Social History   Tobacco Use  . Smoking status: Never Smoker  . Smokeless tobacco: Never Used  Substance Use Topics  . Alcohol use: No    Alcohol/week: 0.0 standard drinks  . Drug use: No     Allergies   Gadolinium derivatives   Review of Systems Review of Systems  Constitutional: Positive for chills, fatigue and fever.  HENT: Positive for rhinorrhea. Negative for ear discharge, ear pain and sore throat.   Respiratory: Positive for cough. Negative for shortness of breath.   Cardiovascular: Negative for chest pain.  Gastrointestinal: Negative for abdominal pain, constipation, diarrhea, nausea and vomiting.  Genitourinary: Negative for dysuria  and hematuria.  Musculoskeletal: Positive for myalgias. Negative for arthralgias.  Skin: Negative for color change and rash.  Allergic/Immunologic: Negative for immunocompromised state.  Neurological: Negative for weakness and numbness.  Psychiatric/Behavioral: Negative for confusion.   All other systems reviewed and are negative for acute change except as noted in the HPI.    Physical Exam Updated Vital Signs BP 105/87 (BP Location: Left Arm)   Pulse 86   Temp 98.6 F  (37 C) (Oral)   Resp 18   SpO2 97%   Physical Exam Vitals signs and nursing note reviewed.  Constitutional:      General: He is not in acute distress.    Appearance: Normal appearance. He is well-developed. He is not toxic-appearing.     Comments: Afebrile, nontoxic, NAD  HENT:     Head: Normocephalic and atraumatic.  Eyes:     General:        Right eye: No discharge.        Left eye: No discharge.     Conjunctiva/sclera: Conjunctivae normal.  Neck:     Musculoskeletal: Normal range of motion and neck supple.  Cardiovascular:     Rate and Rhythm: Normal rate and regular rhythm.     Pulses: Normal pulses.     Heart sounds: Normal heart sounds, S1 normal and S2 normal. No murmur. No friction rub. No gallop.   Pulmonary:     Effort: Pulmonary effort is normal. No respiratory distress.     Breath sounds: Normal breath sounds. No decreased breath sounds, wheezing, rhonchi or rales.     Comments: CTAB in all lung fields, no w/r/r, no hypoxia or increased WOB, speaking in full sentences, SpO2 97% on RA  Abdominal:     General: Bowel sounds are normal. There is no distension.     Palpations: Abdomen is soft. Abdomen is not rigid.     Tenderness: There is no abdominal tenderness. There is no right CVA tenderness, left CVA tenderness, guarding or rebound. Negative signs include Murphy's sign and McBurney's sign.  Musculoskeletal: Normal range of motion.  Skin:    General: Skin is warm and dry.     Findings: No rash.  Neurological:     Mental Status: He is alert and oriented to person, place, and time.     Sensory: Sensation is intact. No sensory deficit.     Motor: Motor function is intact.  Psychiatric:        Mood and Affect: Mood and affect normal.        Behavior: Behavior normal.      ED Treatments / Results  Labs (all labs ordered are listed, but only abnormal results are displayed) Labs Reviewed - No data to display  EKG None  Radiology Dg Chest 2 View  Result  Date: 10/09/2018 CLINICAL DATA:  Productive cough, fever and myalgias for the past 4 days. EXAM: CHEST - 2 VIEW COMPARISON:  02/12/2016. FINDINGS: Normal sized heart. Clear lungs. Mild peribronchial thickening. Unremarkable bones. IMPRESSION: Mild bronchitic changes. Electronically Signed   By: Beckie SaltsSteven  Reid M.D.   On: 10/09/2018 13:30    Procedures Procedures (including critical care time)  Medications Ordered in ED Medications - No data to display   Initial Impression / Assessment and Plan / ED Course  I have reviewed the triage vital signs and the nursing notes.  Pertinent labs & imaging results that were available during my care of the patient were reviewed by me and considered in my medical decision making (  see chart for details).     58 y.o. male here with flulike symptoms for the last 4 days.  On exam, clear lung exam, afebrile, nontoxic-appearing, in no acute distress.  No abdominal tenderness.  Will obtain chest x-ray and reassess shortly.  3:43 PM CXR showing mild bronchitic changes. Will treat empirically for bacterial bronchitis. Pt also with flu symptoms, but outside of tamiflu window, doubt need for tx with tamiflu. Advised other OTC remedies for symptomatic relief, and f/up with PCP in 1wk for recheck. I explained the diagnosis and have given explicit precautions to return to the ER including for any other new or worsening symptoms. The patient understands and accepts the medical plan as it's been dictated and I have answered their questions. Discharge instructions concerning home care and prescriptions have been given. The patient is STABLE and is discharged to home in good condition.    Final Clinical Impressions(s) / ED Diagnoses   Final diagnoses:  Flu-like symptoms  Bronchitis    ED Discharge Orders         Ordered    azithromycin (ZITHROMAX Z-PAK) 250 MG tablet     10/09/18 [address]1543           Omir Cooprider, FloralMercedes, New JerseyPA-C 10/09/18 1543    Alvira MondaySchlossman, Erin,  MD 10/10/18 [phone]

## 2018-10-09 NOTE — ED Notes (Signed)
Patient transported to X-ray 

## 2020-01-09 ENCOUNTER — Ambulatory Visit (INDEPENDENT_AMBULATORY_CARE_PROVIDER_SITE_OTHER): Payer: 59 | Admitting: Registered Nurse

## 2020-01-09 ENCOUNTER — Ambulatory Visit (INDEPENDENT_AMBULATORY_CARE_PROVIDER_SITE_OTHER): Payer: 59

## 2020-01-09 ENCOUNTER — Other Ambulatory Visit: Payer: Self-pay

## 2020-01-09 ENCOUNTER — Encounter: Payer: Self-pay | Admitting: Registered Nurse

## 2020-01-09 VITALS — BP 166/82 | HR 66 | Temp 98.2°F | Ht 65.0 in | Wt 170.2 lb

## 2020-01-09 DIAGNOSIS — M542 Cervicalgia: Secondary | ICD-10-CM

## 2020-01-09 MED ORDER — DICLOFENAC SODIUM 75 MG PO TBEC: 75.0000 mg | DELAYED_RELEASE_TABLET | Freq: Two times a day (BID) | ORAL | 0 refills | Status: DC

## 2020-01-09 MED ORDER — METHOCARBAMOL 500 MG PO TABS: 500.0000 mg | ORAL_TABLET | Freq: Four times a day (QID) | ORAL | 0 refills | Status: DC

## 2020-01-09 MED ORDER — TRAMADOL HCL 50 MG PO TABS
50.0000 mg | ORAL_TABLET | Freq: Three times a day (TID) | ORAL | 0 refills | Status: AC | PRN
Start: 2020-01-09 — End: 2020-01-14

## 2020-01-09 MED ORDER — PREDNISONE 10 MG (21) PO TBPK: ORAL_TABLET | ORAL | 0 refills | Status: DC

## 2020-01-09 NOTE — Progress Notes (Signed)
Established Patient Office Visit  Subjective:  Patient ID: Nicolas Thompson, male    DOB: 1960-12-02  Age: 59 y.o. MRN: [id number]  CC:  Chief Complaint  Patient presents with  . Neck Pain    patient states since last week he has been having neck pain that has made it hard for him to turn head. Per patient he has been taking tylenol for pain but no relief    HPI Nicolas Thompson presents for neck pain Onset around a week ago, worsening. Some radiation to shoulders. No peripheral symptoms, headaches, visual changes, loss of bowel or bladder control Denies acute injury or precipitating event.  Been taking OTCs with no relief Finding it difficult to complete ADLs, nearly impossible to work. Limited ROM in all aspects of neck.  Past Medical History:  Diagnosis Date  . Allergy   . Hypertension   . Neuromuscular disorder ([place])     No past surgical history on file.  No family history on file.  Social History   Socioeconomic History  . Marital status: Married    Spouse name: Not on file  . Number of children: Not on file  . Years of education: Not on file  . Highest education level: Not on file  Occupational History  . Not on file  Tobacco Use  . Smoking status: Never Smoker  . Smokeless tobacco: Never Used  Substance and Sexual Activity  . Alcohol use: No    Alcohol/week: 0.0 standard drinks  . Drug use: No  . Sexual activity: Yes  Other Topics Concern  . Not on file  Social History Narrative  . Not on file   Social Determinants of Health   Financial Resource Strain:   . Difficulty of Paying Living Expenses:   Food Insecurity:   . Worried About Charity fundraiser in the Last Year:   . Arboriculturist in the Last Year:   Transportation Needs:   . Film/video editor (Medical):   Marland Kitchen Lack of Transportation (Non-Medical):   Physical Activity:   . Days of Exercise per Week:   . Minutes of Exercise per Session:   Stress:   . Feeling of Stress :   Social Connections:   .  Frequency of Communication with Friends and Family:   . Frequency of Social Gatherings with Friends and Family:   . Attends Religious Services:   . Active Member of Clubs or Organizations:   . Attends Archivist Meetings:   Marland Kitchen Marital Status:   Intimate Partner Violence:   . Fear of Current or Ex-Partner:   . Emotionally Abused:   Marland Kitchen Physically Abused:   . Sexually Abused:     Outpatient Medications Prior to Visit  Medication Sig Dispense Refill  . ampicillin (PRINCIPEN) 500 MG capsule Take 500 mg by mouth 2 (two) times daily.    Marland Kitchen azithromycin (ZITHROMAX Z-PAK) 250 MG tablet 2 po day one, then 1 daily x 4 days 6 tablet 0  . losartan (COZAAR) 25 MG tablet Take 1 tablet (25 mg total) by mouth daily. 90 tablet 1  . triamcinolone cream (KENALOG) 0.1 % Apply 1 application topically 2 (two) times daily. Only use for 2 weeks at a time 30 g 0  . hydrOXYzine (ATARAX/VISTARIL) 25 MG tablet Take 25 mg by mouth daily.    . meclizine (ANTIVERT) 12.5 MG tablet Take 1 tablet (12.5 mg total) by mouth 2 (two) times daily. (Patient not taking: Reported on 02/12/2016) 30 tablet  0  . meclizine (ANTIVERT) 12.5 MG tablet Take 12.5 mg by mouth daily.     No facility-administered medications prior to visit.    Allergies  Allergen Reactions  . Gadolinium Derivatives     ROS Review of Systems  Constitutional: Negative.   HENT: Negative.   Eyes: Negative.   Respiratory: Negative.   Cardiovascular: Negative.   Endocrine: Negative.   Genitourinary: Negative.   Musculoskeletal: Positive for neck pain and neck stiffness. Negative for arthralgias, back pain, gait problem, joint swelling and myalgias.  Skin: Negative.   Allergic/Immunologic: Negative.   Neurological: Negative.   Hematological: Negative.   Psychiatric/Behavioral: Negative.   All other systems reviewed and are negative.     Objective:    Physical Exam  Constitutional: He is oriented to person, place, and time. He appears  well-developed and well-nourished. No distress.  Cardiovascular: Normal rate and regular rhythm.  Pulmonary/Chest: Effort normal. No respiratory distress.  Musculoskeletal:        General: Tenderness (posterior neck) present. No deformity or edema.  Neurological: He is alert and oriented to person, place, and time.  Skin: Skin is warm and dry. No rash noted. He is not diaphoretic. No erythema. No pallor.  Psychiatric: He has a normal mood and affect. His behavior is normal. Judgment and thought content normal.  Nursing note and vitals reviewed.   BP (!) 166/82   Pulse 66   Temp 98.2 F (36.8 C) (Temporal)   Ht 5\' 5"  (1.651 m)   Wt 170 lb 3.2 oz (77.2 kg)   SpO2 96%   BMI 28.32 kg/m  Wt Readings from Last 3 Encounters:  01/09/20 170 lb 3.2 oz (77.2 kg)  04/23/18 167 lb 12.8 oz (76.1 kg)  02/12/16 175 lb (79.4 kg)     Health Maintenance Due  Topic Date Due  . HIV Screening  Never done  . TETANUS/TDAP  Never done  . COLONOSCOPY  Never done    There are no preventive care reminders to display for this patient.  Lab Results  Component Value Date   TSH 1.91 12/10/2015   Lab Results  Component Value Date   WBC 6.7 02/12/2016   HGB 13.8 02/12/2016   HCT 40.0 02/12/2016   MCV 87.7 02/12/2016   PLT 124 (L) 02/12/2016   Lab Results  Component Value Date   NA 144 04/23/2018   K 3.7 04/23/2018   CO2 20 04/23/2018   GLUCOSE 125 (H) 04/23/2018   BUN 15 04/23/2018   CREATININE 1.13 04/23/2018   BILITOT 0.7 02/12/2016   ALKPHOS 49 02/12/2016   AST 43 (H) 02/12/2016   ALT 32 02/12/2016   PROT 6.2 (L) 02/12/2016   ALBUMIN 3.4 (L) 02/12/2016   CALCIUM 9.6 04/23/2018   ANIONGAP 7 02/12/2016   No results found for: CHOL No results found for: HDL No results found for: LDLCALC No results found for: TRIG No results found for: CHOLHDL No results found for: 02/14/2016    Assessment & Plan:   Problem List Items Addressed This Visit    None    Visit Diagnoses    Neck  pain, acute    -  Primary   Relevant Medications   predniSONE (STERAPRED UNI-PAK 21 TAB) 10 MG (21) TBPK tablet   diclofenac (VOLTAREN) 75 MG EC tablet   traMADol (ULTRAM) 50 MG tablet   methocarbamol (ROBAXIN) 500 MG tablet   Other Relevant Orders   DG Cervical Spine Complete (Completed)   Ambulatory referral to Physical  Therapy      Meds ordered this encounter  Medications  . predniSONE (STERAPRED UNI-PAK 21 TAB) 10 MG (21) TBPK tablet    Sig: Take per package instructions. Do not skip doses. Finish entire pack.    Dispense:  1 each    Refill:  0    Order Specific Question:   Supervising Provider    Answer:   Collie Siad A [id number]  . diclofenac (VOLTAREN) 75 MG EC tablet    Sig: Take 1 tablet (75 mg total) by mouth 2 (two) times daily.    Dispense:  30 tablet    Refill:  0    Order Specific Question:   Supervising Provider    Answer:   Collie Siad A [id number]  . traMADol (ULTRAM) 50 MG tablet    Sig: Take 1 tablet (50 mg total) by mouth every 8 (eight) hours as needed for up to 5 days.    Dispense:  15 tablet    Refill:  0    Order Specific Question:   Supervising Provider    Answer:   Collie Siad A [id number]  . methocarbamol (ROBAXIN) 500 MG tablet    Sig: Take 1 tablet (500 mg total) by mouth 4 (four) times daily.    Dispense:  60 tablet    Refill:  0    Order Specific Question:   Supervising Provider    Answer:   Doristine Bosworth [id number]    Follow-up: No follow-ups on file.   PLAN  Foraminal narrowing on xray  Diclofenac, methocarbamol, tramadol for pain  PT   Return prn  Patient encouraged to call clinic with any questions, comments, or concerns.  Janeece Agee, NP

## 2020-01-09 NOTE — Patient Instructions (Addendum)
Mr Nicolas Thompson to see you today.   Medications:  Prednisone: take per package instructions. This is a taper - so you will take less and less each day. This will provide some immediate relief.  Diclofenac: take this twice daily with something to eat. This is a pain reliever.  Methocarbamol: Take this up to 4 times daily as needed. This is a muscle relaxer.  Tramadol: take this before bed as needed once each day. This is a pain reliever.  Physical Therapy: They will call you / your daughter to schedule an appointment. This will be best for long term relief.  Please let me know if you have any questions.    [name] -  R?t vui ???c g?p b?n hm nay.  Thu?c:  Prednisone: dng theo h??ng d?n trn bao b. ?y l m?t lo?i cn - v v?y b?n s? m?t t h?n v t h?n m?i ngy. ?i?u ny s? cung c?p m?t s? c?u tr? ngay l?p t?c.  Diclofenac: dng thu?c ny hai l?n m?i ngy cng v?i th? g ? ?? ?n. ?y l m?t lo?i thu?c gi?m ?au.  Methocarbamol: U?ng t?i ?a 4 l?n m?i ngy n?u c?n. ?y l m?t lo?i thu?c gin c?.  Tramadol: u?ng thu?c ny tr??c khi ?i ng? m?t l?n m?i ngy. ?y l m?t lo?i thu?c gi?m ?au.  V?t l tr? li?u: H? s? g?i cho b?n / con gi b?n ?? s?p x?p m?t cu?c h?n. ?i?u ny s? l t?t nh?t ?? gi?m ?au lu di.  Hy cho ti bi?t n?u b?n c b?t c? th?c m?c no.   If you have lab work done today you will be contacted with your lab results within the next 2 weeks.  If you have not heard from [place] then please contact us. The fastest way to get your results is to register for My Chart.   IF you received an x-ray today, you will receive an invoice from [facility]. Please contact [facility] at [phone] with questions or concerns regarding your invoice.   IF you received labwork today, you will receive an invoice from [facility]. Please contact [facility] at [phone] with questions or concerns regarding your invoice.   Our billing staff will not be able to assist  you with questions regarding bills from these companies.  You will be contacted with the lab results as soon as they are available. The fastest way to get your results is to activate your My Chart account. Instructions are located on the last page of this paperwork. If you have not heard from [place] regarding the results in 2 weeks, please contact this office.

## 2020-01-23 ENCOUNTER — Ambulatory Visit (INDEPENDENT_AMBULATORY_CARE_PROVIDER_SITE_OTHER): Payer: 59 | Admitting: Registered Nurse

## 2020-01-23 ENCOUNTER — Other Ambulatory Visit: Payer: Self-pay

## 2020-01-23 ENCOUNTER — Encounter: Payer: Self-pay | Admitting: Registered Nurse

## 2020-01-23 VITALS — BP 173/75 | HR 72 | Temp 98.2°F | Ht 71.0 in | Wt 167.2 lb

## 2020-01-23 DIAGNOSIS — Z1159 Encounter for screening for other viral diseases: Secondary | ICD-10-CM

## 2020-01-23 DIAGNOSIS — Z13220 Encounter for screening for lipoid disorders: Secondary | ICD-10-CM

## 2020-01-23 DIAGNOSIS — Z1329 Encounter for screening for other suspected endocrine disorder: Secondary | ICD-10-CM | POA: Diagnosis not present

## 2020-01-23 DIAGNOSIS — Z23 Encounter for immunization: Secondary | ICD-10-CM | POA: Diagnosis not present

## 2020-01-23 DIAGNOSIS — H8110 Benign paroxysmal vertigo, unspecified ear: Secondary | ICD-10-CM

## 2020-01-23 DIAGNOSIS — M542 Cervicalgia: Secondary | ICD-10-CM

## 2020-01-23 DIAGNOSIS — Z130 Encounter for screening for diseases of the blood and blood-forming organs and certain disorders involving the immune mechanism: Secondary | ICD-10-CM

## 2020-01-23 DIAGNOSIS — Z1211 Encounter for screening for malignant neoplasm of colon: Secondary | ICD-10-CM | POA: Diagnosis not present

## 2020-01-23 DIAGNOSIS — Z0001 Encounter for general adult medical examination with abnormal findings: Secondary | ICD-10-CM

## 2020-01-23 DIAGNOSIS — I10 Essential (primary) hypertension: Secondary | ICD-10-CM

## 2020-01-23 DIAGNOSIS — Z13228 Encounter for screening for other metabolic disorders: Secondary | ICD-10-CM

## 2020-01-23 MED ORDER — DICLOFENAC SODIUM 75 MG PO TBEC: 75.0000 mg | DELAYED_RELEASE_TABLET | Freq: Two times a day (BID) | ORAL | 0 refills | Status: DC

## 2020-01-23 MED ORDER — METHOCARBAMOL 500 MG PO TABS: 500.0000 mg | ORAL_TABLET | Freq: Four times a day (QID) | ORAL | 0 refills | Status: DC

## 2020-01-23 MED ORDER — LOSARTAN POTASSIUM 50 MG PO TABS: 50.0000 mg | ORAL_TABLET | Freq: Every day | ORAL | 3 refills | Status: DC

## 2020-01-23 MED ORDER — MECLIZINE HCL 12.5 MG PO TABS: 12.5000 mg | ORAL_TABLET | Freq: Three times a day (TID) | ORAL | 0 refills | Status: AC | PRN

## 2020-01-23 NOTE — Patient Instructions (Signed)
° ° ° °  If you have lab work done today you will be contacted with your lab results within the next 2 weeks.  If you have not heard from us then please contact us. The fastest way to get your results is to register for My Chart. ° ° °IF you received an x-ray today, you will receive an invoice from [facility]. Please contact [facility] at [phone] with questions or concerns regarding your invoice.  ° °IF you received labwork today, you will receive an invoice from [facility]. Please contact [facility] at [phone] with questions or concerns regarding your invoice.  ° °Our billing staff will not be able to assist you with questions regarding bills from these companies. ° °You will be contacted with the lab results as soon as they are available. The fastest way to get your results is to activate your My Chart account. Instructions are located on the last page of this paperwork. If you have not heard from us regarding the results in 2 weeks, please contact this office. °  ° ° ° °

## 2020-01-23 NOTE — Progress Notes (Signed)
Established Patient Office Visit  Subjective:  Patient ID: Nicolas Thompson, male    DOB: Jun 18, 1961  Age: 59 y.o. MRN: [id number]  CC:  Chief Complaint  Patient presents with  . Establish Care    hypertension/bp and neck pain for the past 2 weeks    HPI Nicolas Thompson presents for CPE and labs  Neck pain improved with meds and stretching but still present and interfering with work. PT referral had been sent but unfortunately they were unable to contact pt. Provided pt daughter with number for PT   Otherwise, no concerns. Has run out of BP, neck pain, and vertigo medications.  Past Medical History:  Diagnosis Date  . Allergy   . Hypertension   . Neuromuscular disorder (HCC)     History reviewed. No pertinent surgical history.  History reviewed. No pertinent family history.  Social History   Socioeconomic History  . Marital status: Married    Spouse name: Not on file  . Number of children: 3  . Years of education: Not on file  . Highest education level: Not on file  Occupational History  . Not on file  Tobacco Use  . Smoking status: Never Smoker  . Smokeless tobacco: Never Used  Substance and Sexual Activity  . Alcohol use: No    Alcohol/week: 0.0 standard drinks  . Drug use: No  . Sexual activity: Yes  Other Topics Concern  . Not on file  Social History Narrative  . Not on file   Social Determinants of Health   Financial Resource Strain:   . Difficulty of Paying Living Expenses:   Food Insecurity:   . Worried About Programme researcher, broadcasting/film/video in the Last Year:   . Barista in the Last Year:   Transportation Needs:   . Freight forwarder (Medical):   Marland Kitchen Lack of Transportation (Non-Medical):   Physical Activity:   . Days of Exercise per Week:   . Minutes of Exercise per Session:   Stress:   . Feeling of Stress :   Social Connections:   . Frequency of Communication with Friends and Family:   . Frequency of Social Gatherings with Friends and Family:   .  Attends Religious Services:   . Active Member of Clubs or Organizations:   . Attends Banker Meetings:   Marland Kitchen Marital Status:   Intimate Partner Violence:   . Fear of Current or Ex-Partner:   . Emotionally Abused:   Marland Kitchen Physically Abused:   . Sexually Abused:     Outpatient Medications Prior to Visit  Medication Sig Dispense Refill  . ampicillin (PRINCIPEN) 500 MG capsule Take 500 mg by mouth 2 (two) times daily.    Marland Kitchen azithromycin (ZITHROMAX Z-PAK) 250 MG tablet 2 po day one, then 1 daily x 4 days 6 tablet 0  . hydrOXYzine (ATARAX/VISTARIL) 25 MG tablet Take 25 mg by mouth daily.    Marland Kitchen losartan (COZAAR) 25 MG tablet Take 1 tablet (25 mg total) by mouth daily. 90 tablet 1  . meclizine (ANTIVERT) 12.5 MG tablet Take 1 tablet (12.5 mg total) by mouth 2 (two) times daily. 30 tablet 0  . meclizine (ANTIVERT) 12.5 MG tablet Take 12.5 mg by mouth daily.    . predniSONE (STERAPRED UNI-PAK 21 TAB) 10 MG (21) TBPK tablet Take per package instructions. Do not skip doses. Finish entire pack. 1 each 0  . triamcinolone cream (KENALOG) 0.1 % Apply 1 application topically 2 (two) times daily. Only  use for 2 weeks at a time 30 g 0  . diclofenac (VOLTAREN) 75 MG EC tablet Take 1 tablet (75 mg total) by mouth 2 (two) times daily. 30 tablet 0  . methocarbamol (ROBAXIN) 500 MG tablet Take 1 tablet (500 mg total) by mouth 4 (four) times daily. 60 tablet 0   No facility-administered medications prior to visit.    Allergies  Allergen Reactions  . Gadolinium Derivatives     ROS Review of Systems  Constitutional: Negative.   HENT: Negative.   Eyes: Negative.   Respiratory: Negative.   Cardiovascular: Negative.   Gastrointestinal: Negative.   Endocrine: Negative.   Genitourinary: Negative.   Musculoskeletal: Positive for neck pain and neck stiffness. Negative for arthralgias, back pain, gait problem, joint swelling and myalgias.  Skin: Negative.   Allergic/Immunologic: Negative.     Neurological: Negative.   Hematological: Negative.   Psychiatric/Behavioral: Negative.   All other systems reviewed and are negative.     Objective:    Physical Exam  Constitutional: He is oriented to person, place, and time. He appears well-developed and well-nourished. No distress.  HENT:  Head: Normocephalic and atraumatic.  Right Ear: External ear normal.  Left Ear: External ear normal.  Nose: Nose normal.  Mouth/Throat: Oropharynx is clear and moist. No oropharyngeal exudate.  Eyes: Pupils are equal, round, and reactive to light. Conjunctivae and EOM are normal. Right eye exhibits no discharge. Left eye exhibits no discharge. No scleral icterus.  Neck: No JVD present. No tracheal deviation present. No thyromegaly present.  Cardiovascular: Normal rate, regular rhythm, normal heart sounds and intact distal pulses. Exam reveals no gallop and no friction rub.  No murmur heard. Pulmonary/Chest: Effort normal and breath sounds normal. No respiratory distress. He has no wheezes. He has no rales. He exhibits no tenderness.  Abdominal: Soft. Bowel sounds are normal. He exhibits no distension and no mass. There is no abdominal tenderness. There is no rebound and no guarding.  Musculoskeletal:        General: Tenderness (c spine) present. No deformity or edema. Normal range of motion.     Cervical back: Normal range of motion and neck supple.  Lymphadenopathy:    He has no cervical adenopathy.  Neurological: He is alert and oriented to person, place, and time. No cranial nerve deficit. He exhibits normal muscle tone. Coordination normal.  Skin: Skin is warm and dry. No rash noted. He is not diaphoretic. No erythema. No pallor.  Psychiatric: He has a normal mood and affect. His behavior is normal. Judgment and thought content normal.  Nursing note and vitals reviewed.   BP (!) 173/75 (BP Location: Left Arm, Patient Position: Sitting, Cuff Size: Normal)   Pulse 72   Temp 98.2 F (36.8  C) (Temporal)   Ht 5\' 11"  (1.803 m)   Wt 167 lb 3.2 oz (75.8 kg)   SpO2 98%   BMI 23.32 kg/m  Wt Readings from Last 3 Encounters:  01/23/20 167 lb 3.2 oz (75.8 kg)  01/09/20 170 lb 3.2 oz (77.2 kg)  04/23/18 167 lb 12.8 oz (76.1 kg)     Health Maintenance Due  Topic Date Due  . HIV Screening  Never done  . COLONOSCOPY  Never done    There are no preventive care reminders to display for this patient.  Lab Results  Component Value Date   TSH 1.91 12/10/2015   Lab Results  Component Value Date   WBC 6.7 02/12/2016   HGB 13.8 02/12/2016  HCT 40.0 02/12/2016   MCV 87.7 02/12/2016   PLT 124 (L) 02/12/2016   Lab Results  Component Value Date   NA 144 04/23/2018   K 3.7 04/23/2018   CO2 20 04/23/2018   GLUCOSE 125 (H) 04/23/2018   BUN 15 04/23/2018   CREATININE 1.13 04/23/2018   BILITOT 0.7 02/12/2016   ALKPHOS 49 02/12/2016   AST 43 (H) 02/12/2016   ALT 32 02/12/2016   PROT 6.2 (L) 02/12/2016   ALBUMIN 3.4 (L) 02/12/2016   CALCIUM 9.6 04/23/2018   ANIONGAP 7 02/12/2016   No results found for: CHOL No results found for: HDL No results found for: LDLCALC No results found for: TRIG No results found for: CHOLHDL No results found for: HGBA1C    Assessment & Plan:   Problem List Items Addressed This Visit    None    Visit Diagnoses    Screening for viral disease    -  Primary   Relevant Orders   HIV antibody (with reflex)   Special screening for malignant neoplasms, colon       Relevant Orders   Ambulatory referral to Gastroenterology   Screening for endocrine, metabolic and immunity disorder       Relevant Orders   TSH   CBC with Differential   Comprehensive metabolic panel   Hemoglobin A1c   Lipid screening       Relevant Orders   Lipid panel   Neck pain, acute       Relevant Medications   diclofenac (VOLTAREN) 75 MG EC tablet   methocarbamol (ROBAXIN) 500 MG tablet      Meds ordered this encounter  Medications  . diclofenac (VOLTAREN) 75  MG EC tablet    Sig: Take 1 tablet (75 mg total) by mouth 2 (two) times daily.    Dispense:  30 tablet    Refill:  0    Order Specific Question:   Supervising Provider    Answer:   Delia Chimes A [id number]  . methocarbamol (ROBAXIN) 500 MG tablet    Sig: Take 1 tablet (500 mg total) by mouth 4 (four) times daily.    Dispense:  60 tablet    Refill:  0    Order Specific Question:   Supervising Provider    Answer:   Forrest Moron [id number]    Follow-up: No follow-ups on file.   PLAN  Refill meclizine  Increase losartan to 50mg    Refill diclofenac and robaxin  Provided number for PT  c spine tenderness, otherwise unremarkable exam  Labs collected, will follow up as warranted  Patient encouraged to call clinic with any questions, comments, or concerns.  Nicolas Coss, NP

## 2020-01-24 LAB — COMPREHENSIVE METABOLIC PANEL
ALT: 50 IU/L — ABNORMAL HIGH (ref 0–44)
AST: 34 IU/L (ref 0–40)
Albumin/Globulin Ratio: 1.3 (ref 1.2–2.2)
Albumin: 4.3 g/dL (ref 3.8–4.9)
Alkaline Phosphatase: 68 IU/L (ref 39–117)
BUN/Creatinine Ratio: 17 (ref 9–20)
BUN: 20 mg/dL (ref 6–24)
Bilirubin Total: 0.5 mg/dL (ref 0.0–1.2)
CO2: 23 mmol/L (ref 20–29)
Calcium: 9.4 mg/dL (ref 8.7–10.2)
Chloride: 101 mmol/L (ref 96–106)
Creatinine, Ser: 1.21 mg/dL (ref 0.76–1.27)
GFR calc Af Amer: 75 mL/min/{1.73_m2} (ref >59)
GFR calc non Af Amer: 65 mL/min/{1.73_m2} (ref >59)
Globulin, Total: 3.2 g/dL (ref 1.5–4.5)
Glucose: 129 mg/dL — ABNORMAL HIGH (ref 65–99)
Potassium: 4.2 mmol/L (ref 3.5–5.2)
Sodium: 139 mmol/L (ref 134–144)
Total Protein: 7.5 g/dL (ref 6.0–8.5)

## 2020-01-24 LAB — HEMOGLOBIN A1C
Est. average glucose Bld gHb Est-mCnc: 108 mg/dL
Hgb A1c MFr Bld: 5.4 % (ref 4.8–5.6)

## 2020-01-24 LAB — LIPID PANEL
Chol/HDL Ratio: 6.9 ratio — ABNORMAL HIGH (ref 0.0–5.0)
Cholesterol, Total: 229 mg/dL — ABNORMAL HIGH (ref 100–199)
HDL: 33 mg/dL — ABNORMAL LOW (ref >39)
LDL Chol Calc (NIH): 121 mg/dL — ABNORMAL HIGH (ref 0–99)
Triglycerides: 421 mg/dL — ABNORMAL HIGH (ref 0–149)
VLDL Cholesterol Cal: 75 mg/dL — ABNORMAL HIGH (ref 5–40)

## 2020-01-24 LAB — CBC WITH DIFFERENTIAL/PLATELET
Basophils Absolute: 0 10*3/uL (ref 0.0–0.2)
Basos: 0 %
EOS (ABSOLUTE): 0.3 10*3/uL (ref 0.0–0.4)
Eos: 4 %
Hematocrit: 47.1 % (ref 37.5–51.0)
Hemoglobin: 15.8 g/dL (ref 13.0–17.7)
Immature Grans (Abs): 0 10*3/uL (ref 0.0–0.1)
Immature Granulocytes: 1 %
Lymphocytes Absolute: 1.3 10*3/uL (ref 0.7–3.1)
Lymphs: 19 %
MCH: 31 pg (ref 26.6–33.0)
MCHC: 33.5 g/dL (ref 31.5–35.7)
MCV: 92 fL (ref 79–97)
Monocytes Absolute: 0.4 10*3/uL (ref 0.1–0.9)
Monocytes: 6 %
Neutrophils Absolute: 4.8 10*3/uL (ref 1.4–7.0)
Neutrophils: 70 %
Platelets: 173 10*3/uL (ref 150–450)
RBC: 5.1 x10E6/uL (ref 4.14–5.80)
RDW: 12.9 % (ref 11.6–15.4)
WBC: 6.9 10*3/uL (ref 3.4–10.8)

## 2020-01-24 LAB — TSH: TSH: 1.05 u[IU]/mL (ref 0.450–4.500)

## 2020-01-24 LAB — HIV ANTIBODY (ROUTINE TESTING W REFLEX): HIV Screen 4th Generation wRfx: NONREACTIVE

## 2020-02-02 ENCOUNTER — Encounter: Payer: Self-pay | Admitting: Gastroenterology

## 2020-02-07 ENCOUNTER — Other Ambulatory Visit: Payer: Self-pay | Admitting: Registered Nurse

## 2020-02-07 DIAGNOSIS — E782 Mixed hyperlipidemia: Secondary | ICD-10-CM

## 2020-02-07 MED ORDER — ROSUVASTATIN CALCIUM 10 MG PO TABS: 10.0000 mg | ORAL_TABLET | Freq: Every day | ORAL | 3 refills | Status: DC

## 2020-02-07 NOTE — Progress Notes (Signed)
Good evening  If we could call Nicolas Thompson -   His cholesterol is very high. He needs to start rosuvastatin 10 mg every night with dinner. With any muscle aches, urinary abnormalities, or upper respiratory symptoms,he should stop taking it and come in for an appt.  THank you,  Jari Sportsman, NP

## 2020-03-27 ENCOUNTER — Ambulatory Visit (AMBULATORY_SURGERY_CENTER): Payer: Self-pay

## 2020-03-27 ENCOUNTER — Other Ambulatory Visit: Payer: Self-pay

## 2020-03-27 ENCOUNTER — Other Ambulatory Visit: Payer: Self-pay | Admitting: Registered Nurse

## 2020-03-27 VITALS — Ht 67.0 in | Wt 166.0 lb

## 2020-03-27 DIAGNOSIS — M542 Cervicalgia: Secondary | ICD-10-CM

## 2020-03-27 DIAGNOSIS — Z1211 Encounter for screening for malignant neoplasm of colon: Secondary | ICD-10-CM

## 2020-03-27 NOTE — Progress Notes (Addendum)
No egg or soy allergy known to patient  No issues with past sedation with any surgeries or procedures no intubation problems in the past  No diet pills per patient No home 02 use per patient  No blood thinners per patient  Pt denies issues with constipation  No A fib or A flutter  EMMI video to pt or MyChart  COVID 19 guidelines implemented in [place] today   PV WITH [place] INTERPRETER, also daughter speaks [place] and reviewed instructions with her.  PT HAS BEEN VACCINATED FOR COVID.  Pt record states pt is 5\' 11" , however pt is really 5'7"  Due to the COVID-19 pandemic we are asking patients to follow these guidelines. Please only bring one care partner. Please be aware that your care partner may wait in the car in the parking lot or if they feel like they will be too hot to wait in the car, they may wait in the lobby on the 4th floor. All care partners are required to wear a mask the entire time (we do not have any that we can provide them), they need to practice social distancing, and we will do a Covid check for all patient's and care partners when you arrive. Also we will check their temperature and your temperature. If the care partner waits in their car they need to stay in the parking lot the entire time and we will call them on their cell phone when the patient is ready for discharge so they can bring the car to the front of the building. Also all patient's will need to wear a mask into building.

## 2020-03-28 NOTE — Telephone Encounter (Signed)
Patient is requesting a refill of the following medications: Requested Prescriptions   Pending Prescriptions Disp Refills   diclofenac (VOLTAREN) 75 MG EC tablet [Pharmacy Med Name: DICLOFENAC SODIUM 75MG  DR TABLETS] 30 tablet 0    Sig: TAKE 1 TABLET(75 MG) BY MOUTH TWICE DAILY   methocarbamol (ROBAXIN) 500 MG tablet [Pharmacy Med Name: METHOCARBAMOL 500MG  TABLETS] 60 tablet 0    Sig: TAKE 1 TABLET(500 MG) BY MOUTH FOUR TIMES DAILY    these med was filled 01/23/20 at visit with acute neck pain. Is it ok to refill these meds  Date of patient request: 03/27/20 Last office visit: 01/23/20 Date of last refill: 01/23/20 Last refill amount:  Follow up time period per chart:

## 2020-04-09 ENCOUNTER — Encounter: Payer: Self-pay | Admitting: Gastroenterology

## 2020-04-09 ENCOUNTER — Ambulatory Visit (AMBULATORY_SURGERY_CENTER): Payer: 59 | Admitting: Gastroenterology

## 2020-04-09 ENCOUNTER — Other Ambulatory Visit: Payer: Self-pay

## 2020-04-09 VITALS — BP 110/60 | HR 66 | Temp 96.8°F | Resp 22 | Ht 67.0 in | Wt 166.0 lb

## 2020-04-09 DIAGNOSIS — Z1211 Encounter for screening for malignant neoplasm of colon: Secondary | ICD-10-CM | POA: Diagnosis not present

## 2020-04-09 MED ORDER — SODIUM CHLORIDE 0.9 % IV SOLN: 500.0000 mL | Freq: Once | INTRAVENOUS | Status: DC

## 2020-04-09 NOTE — Op Note (Signed)
[facility] Patient Name: Nicolas Thompson Procedure Date: 04/09/2020 7:58 AM MRN: [id number] Endoscopist: Sherilyn Cooter L. Myrtie Neither , MD Age: 59 Referring MD:  Date of Birth: Dec 14, 1960 Gender: Male Account #: [id number] Procedure:                Colonoscopy Indications:              Screening for colorectal malignant neoplasm, This                            is the patient's first colonoscopy Medicines:                Monitored Anesthesia Care Procedure:                Pre-Anesthesia Assessment:                           - Prior to the procedure, a History and Physical                            was performed, and patient medications and                            allergies were reviewed. The patient's tolerance of                            previous anesthesia was also reviewed. The risks                            and benefits of the procedure and the sedation                            options and risks were discussed with the patient.                            All questions were answered, and informed consent                            was obtained. Prior Anticoagulants: The patient has                            taken no previous anticoagulant or antiplatelet                            agents. ASA Grade Assessment: II - A patient with                            mild systemic disease. After reviewing the risks                            and benefits, the patient was deemed in                            satisfactory condition to undergo the procedure.  After obtaining informed consent, the colonoscope                            was passed under direct vision. Throughout the                            procedure, the patient's blood pressure, pulse, and                            oxygen saturations were monitored continuously. The                            Colonoscope was introduced through the anus and                            advanced to the the terminal  ileum, with                            identification of the appendiceal orifice and IC                            valve. The colonoscopy was performed without                            difficulty. The patient tolerated the procedure                            well. The quality of the bowel preparation was                            excellent. The terminal ileum, ileocecal valve,                            appendiceal orifice, and rectum were photographed.                            The bowel preparation used was Miralax. Scope In: 8:12:26 AM Scope Out: 8:26:06 AM Scope Withdrawal Time: 0 hours 11 minutes 0 seconds  Total Procedure Duration: 0 hours 13 minutes 40 seconds  Findings:                 The perianal and digital rectal examinations were                            normal.                           The terminal ileum appeared normal.                           The entire examined colon appeared normal on direct                            and retroflexion views. Complications:            No immediate complications.  Estimated Blood Loss:     Estimated blood loss: none. Impression:               - The examined portion of the ileum was normal.                           - The entire examined colon is normal on direct and                            retroflexion views.                           - No specimens collected. Recommendation:           - Patient has a contact number available for                            emergencies. The signs and symptoms of potential                            delayed complications were discussed with the                            patient. Return to normal activities tomorrow.                            Written discharge instructions were provided to the                            patient.                           - Resume previous diet.                           - Continue present medications.                           - Repeat colonoscopy in 10 years  for screening                            purposes. Laquesha Holcomb L. Myrtie Neither, MD 04/09/2020 8:31:29 AM This report has been signed electronically.

## 2020-04-09 NOTE — Progress Notes (Signed)
VS by SP   Pt's states no medical or surgical changes since previsit or office visit.    Admission completed with interpreter [place]

## 2020-04-09 NOTE — Patient Instructions (Signed)
YOU HAD AN ENDOSCOPIC PROCEDURE TODAY AT THE [facility]:   Refer to the procedure report that was given to you for any specific questions about what was found during the examination.  If the procedure report does not answer your questions, please call your gastroenterologist to clarify.  If you requested that your care partner not be given the details of your procedure findings, then the procedure report has been included in a sealed envelope for you to review at your convenience later.  YOU SHOULD EXPECT: Some feelings of bloating in the abdomen. Passage of more gas than usual.  Walking can help get rid of the air that was put into your GI tract during the procedure and reduce the bloating. If you had a lower endoscopy (such as a colonoscopy or flexible sigmoidoscopy) you may notice spotting of blood in your stool or on the toilet paper. If you underwent a bowel prep for your procedure, you may not have a normal bowel movement for a few days.  Please Note:  You might notice some irritation and congestion in your nose or some drainage.  This is from the oxygen used during your procedure.  There is no need for concern and it should clear up in a day or so.  SYMPTOMS TO REPORT IMMEDIATELY:   Following lower endoscopy (colonoscopy or flexible sigmoidoscopy):  Excessive amounts of blood in the stool  Significant tenderness or worsening of abdominal pains  Swelling of the abdomen that is new, acute  Fever of 100F or higher  For urgent or emergent issues, a gastroenterologist can be reached at any hour by calling [phone]. Do not use MyChart messaging for urgent concerns.    DIET:  We do recommend a small meal at first, but then you may proceed to your regular diet.  Drink plenty of fluids but you should avoid alcoholic beverages for 24 hours.  ACTIVITY:  You should plan to take it easy for the rest of today and you should NOT DRIVE or use heavy machinery until tomorrow (because  of the sedation medicines used during the test).    FOLLOW UP: Our staff will call the number listed on your records 48-72 hours following your procedure to check on you and address any questions or concerns that you may have regarding the information given to you following your procedure. If we do not reach you, we will leave a message.  We will attempt to reach you two times.  During this call, we will ask if you have developed any symptoms of COVID 19. If you develop any symptoms (ie: fever, flu-like symptoms, shortness of breath, cough etc.) before then, please call [phone].  If you test positive for Covid 19 in the 2 weeks post procedure, please call and report this information to us.    If any biopsies were taken you will be contacted by phone or by letter within the next 1-3 weeks.  Please call us at [phone] if you have not heard about the biopsies in 3 weeks.    SIGNATURES/CONFIDENTIALITY: You and/or your care partner have signed paperwork which will be entered into your electronic medical record.  These signatures attest to the fact that that the information above on your After Visit Summary has been reviewed and is understood.  Full responsibility of the confidentiality of this discharge information lies with you and/or your care-partner. 

## 2020-04-09 NOTE — Progress Notes (Signed)
Daughter and interpreter here with pt for Dr's instructions and for d/c instructions, pt feeling fine, no c/o discharged as per protocol, VSS.

## 2020-04-09 NOTE — Progress Notes (Signed)
To PACU VSS. Report to [email or web address] 

## 2020-04-11 ENCOUNTER — Telehealth: Payer: Self-pay | Admitting: *Deleted

## 2020-04-11 ENCOUNTER — Telehealth: Payer: Self-pay

## 2020-04-11 NOTE — Telephone Encounter (Signed)
Follow up call made. 

## 2020-04-11 NOTE — Telephone Encounter (Signed)
Second post procedure follow up call, no answer 

## 2020-04-20 ENCOUNTER — Encounter (HOSPITAL_COMMUNITY): Payer: Self-pay | Admitting: Emergency Medicine

## 2020-04-20 ENCOUNTER — Ambulatory Visit (HOSPITAL_COMMUNITY)
Admission: EM | Admit: 2020-04-20 | Discharge: 2020-04-20 | Disposition: A | Discharge status: Home / Self Care | Payer: 59 | Attending: Family Medicine | Admitting: Family Medicine

## 2020-04-20 ENCOUNTER — Other Ambulatory Visit: Payer: Self-pay

## 2020-04-20 DIAGNOSIS — R21 Rash and other nonspecific skin eruption: Secondary | ICD-10-CM

## 2020-04-20 DIAGNOSIS — L509 Urticaria, unspecified: Secondary | ICD-10-CM | POA: Diagnosis not present

## 2020-04-20 DIAGNOSIS — L209 Atopic dermatitis, unspecified: Secondary | ICD-10-CM | POA: Diagnosis not present

## 2020-04-20 MED ORDER — TRIAMCINOLONE ACETONIDE 0.1 % EX CREA: 1.0000 "application " | TOPICAL_CREAM | Freq: Two times a day (BID) | CUTANEOUS | 0 refills | Status: AC

## 2020-04-20 MED ORDER — CETIRIZINE HCL 10 MG PO TABS: 10.0000 mg | ORAL_TABLET | Freq: Every day | ORAL | 0 refills | Status: AC

## 2020-04-20 NOTE — ED Triage Notes (Signed)
C/o rash all over x 5 days. Pt states rash began on his hands and feet. Pt is using friend to translate. Pt has not been using any OTC medications.

## 2020-04-20 NOTE — Discharge Instructions (Addendum)
You have atopic dermatitis.   Use the steroid cream no more than three times per day.  Follow up with your primary care provider, or to see [place] as needed.  Report to the emergency room for shortness of breath, high fever, severe diarrhea, or other concerning symptoms.

## 2020-04-20 NOTE — ED Provider Notes (Signed)
[facility]   [id number] 04/20/20 Arrival Time: 0802  CC: RASH  SUBJECTIVE:  Nicolas Thompson is a 59 y.o. male who presents with a skin complaint that began 5 days ago. Reports that he was outside in the grass. Denies changes in soaps, detergents, close contacts with similar rash. Denies medications change or starting a new medication recently. Reports that the rash has been everywhere on his body, but is the worst at his hands and feet. Describes it as red and itchy. Has not tried OTC medications for this. Reports that the itching is the most bothersome aspect of this rash. There are no aggravating or alleviating factors. Reports similar symptoms in the past that improved with steroid cream.   Denies fever, chills, nausea, vomiting, erythema, swelling, discharge, oral lesions, SOB, chest pain, abdominal pain, changes in bowel or bladder function.    ROS: As per HPI.  All other pertinent ROS negative.     Past Medical History:  Diagnosis Date  . Allergy   . Hyperlipidemia   . Hypertension   . Neuromuscular disorder (HCC)    History reviewed. No pertinent surgical history. Allergies  Allergen Reactions  . Gadolinium Derivatives    No current facility-administered medications on file prior to encounter.   Current Outpatient Medications on File Prior to Encounter  Medication Sig Dispense Refill  . losartan (COZAAR) 50 MG tablet Take 1 tablet (50 mg total) by mouth daily. 90 tablet 3  . rosuvastatin (CRESTOR) 10 MG tablet Take 1 tablet (10 mg total) by mouth daily. 90 tablet 3  . ampicillin (PRINCIPEN) 500 MG capsule Take 500 mg by mouth 2 (two) times daily.    . diclofenac (VOLTAREN) 75 MG EC tablet TAKE 1 TABLET(75 MG) BY MOUTH TWICE DAILY 30 tablet 0  . hydrOXYzine (ATARAX/VISTARIL) 25 MG tablet Take 25 mg by mouth daily. (Patient not taking: Reported on 03/27/2020)    . meclizine (ANTIVERT) 12.5 MG tablet Take 1 tablet (12.5 mg total) by mouth 3 (three) times daily as needed for  dizziness. 30 tablet 0  . methocarbamol (ROBAXIN) 500 MG tablet TAKE 1 TABLET(500 MG) BY MOUTH FOUR TIMES DAILY 60 tablet 0  . predniSONE (STERAPRED UNI-PAK 21 TAB) 10 MG (21) TBPK tablet Take per package instructions. Do not skip doses. Finish entire pack. 1 each 0   Social History   Socioeconomic History  . Marital status: Married    Spouse name: Not on file  . Number of children: 3  . Years of education: Not on file  . Highest education level: Not on file  Occupational History  . Not on file  Tobacco Use  . Smoking status: Never Smoker  . Smokeless tobacco: Never Used  Vaping Use  . Vaping Use: Never used  Substance and Sexual Activity  . Alcohol use: Yes    Alcohol/week: 1.0 standard drink    Types: 1 Cans of beer per week    Comment: special occasions  . Drug use: No  . Sexual activity: Yes  Other Topics Concern  . Not on file  Social History Narrative  . Not on file   Social Determinants of Health   Financial Resource Strain:   . Difficulty of Paying Living Expenses:   Food Insecurity:   . Worried About Programme researcher, broadcasting/film/video in the Last Year:   . Barista in the Last Year:   Transportation Needs:   . Freight forwarder (Medical):   Marland Kitchen Lack of Transportation (Non-Medical):  Physical Activity:   . Days of Exercise per Week:   . Minutes of Exercise per Session:   Stress:   . Feeling of Stress :   Social Connections:   . Frequency of Communication with Friends and Family:   . Frequency of Social Gatherings with Friends and Family:   . Attends Religious Services:   . Active Member of Clubs or Organizations:   . Attends Banker Meetings:   Marland Kitchen Marital Status:   Intimate Partner Violence:   . Fear of Current or Ex-Partner:   . Emotionally Abused:   Marland Kitchen Physically Abused:   . Sexually Abused:    Family History  Problem Relation Age of Onset  . Colon cancer Neg Hx   . Colon polyps Neg Hx   . Esophageal cancer Neg Hx   . Rectal cancer Neg  Hx   . Stomach cancer Neg Hx     OBJECTIVE: Vitals:   04/20/20 0822  BP: 138/81  Pulse: 78  Resp: 16  Temp: 98.4 F (36.9 C)  TempSrc: Oral  SpO2: 100%    General appearance: alert; no distress Head: NCAT Lungs: clear to auscultation bilaterally Heart: regular rate and rhythm.  Radial pulse 2+ bilaterally Extremities: no edema Skin: warm and dry; erythematous maculopapular rash, patient scratching in office, consistent with atopic dermatitis Psychological: alert and cooperative; normal mood and affect  ASSESSMENT & PLAN:  1. Rash   2. Urticaria   3. Atopic dermatitis, unspecified type     Meds ordered this encounter  Medications  . cetirizine (ZYRTEC ALLERGY) 10 MG tablet    Sig: Take 1 tablet (10 mg total) by mouth daily.    Dispense:  30 tablet    Refill:  0    Order Specific Question:   Supervising Provider    Answer:   Merrilee Jansky [id number]  . triamcinolone cream (KENALOG) 0.1 %    Sig: Apply 1 application topically 2 (two) times daily.    Dispense:  30 g    Refill:  0    Order Specific Question:   Supervising Provider    Answer:   Merrilee Jansky [[id number]]     Prescribed zyrtec (antihistamine) Triamcinolone 0.1% (corticosteroid - itch/ inflammation relief) Take as prescribed and to completion Avoid hot showers/ baths Moisturize skin daily  Follow up with PCP if symptoms persists Return or go to the ER if you have any new or worsening symptoms such as fever, chills, nausea, vomiting, redness, swelling, discharge, if symptoms do not improve with medications  Reviewed expectations re: course of current medical issues. Questions answered. Outlined signs and symptoms indicating need for more acute intervention. Patient verbalized understanding. After Visit Summary given.   Moshe Cipro, NP 04/20/20 [phone]

## 2021-03-15 ENCOUNTER — Ambulatory Visit (HOSPITAL_COMMUNITY)
Admission: EM | Admit: 2021-03-15 | Discharge: 2021-03-15 | Disposition: A | Discharge status: Home / Self Care | Payer: 59 | Attending: Family Medicine | Admitting: Family Medicine

## 2021-03-15 ENCOUNTER — Encounter (HOSPITAL_COMMUNITY): Payer: Self-pay | Admitting: Emergency Medicine

## 2021-03-15 ENCOUNTER — Other Ambulatory Visit: Payer: Self-pay

## 2021-03-15 DIAGNOSIS — E785 Hyperlipidemia, unspecified: Secondary | ICD-10-CM | POA: Diagnosis not present

## 2021-03-15 DIAGNOSIS — M79671 Pain in right foot: Secondary | ICD-10-CM

## 2021-03-15 DIAGNOSIS — S91331A Puncture wound without foreign body, right foot, initial encounter: Secondary | ICD-10-CM

## 2021-03-15 DIAGNOSIS — T148XXA Other injury of unspecified body region, initial encounter: Secondary | ICD-10-CM

## 2021-03-15 DIAGNOSIS — E782 Mixed hyperlipidemia: Secondary | ICD-10-CM

## 2021-03-15 DIAGNOSIS — I10 Essential (primary) hypertension: Secondary | ICD-10-CM | POA: Diagnosis not present

## 2021-03-15 MED ORDER — HIBICLENS 4 % EX LIQD: Freq: Every day | CUTANEOUS | 0 refills | Status: DC | PRN

## 2021-03-15 MED ORDER — ROSUVASTATIN CALCIUM 10 MG PO TABS: 10.0000 mg | ORAL_TABLET | Freq: Every day | ORAL | 0 refills | Status: DC

## 2021-03-15 MED ORDER — LOSARTAN POTASSIUM 50 MG PO TABS: 50.0000 mg | ORAL_TABLET | Freq: Every day | ORAL | 0 refills | Status: AC

## 2021-03-15 MED ORDER — AMOXICILLIN-POT CLAVULANATE 875-125 MG PO TABS
1.0000 | ORAL_TABLET | Freq: Two times a day (BID) | ORAL | 0 refills | Status: DC
Start: 2021-03-15 — End: 2021-04-14

## 2021-03-15 NOTE — ED Provider Notes (Signed)
[facility]    CSN: [id number] Arrival date & time: 03/15/21  1015      History   Chief Complaint No chief complaint on file.   HPI Nicolas Thompson is a 60 y.o. male.   Patient declines medical interpreter today, wishes to use his daughter who is present with him for interpretation.  Patient stepped on a nail with his right foot while gardening today.  She states he had shoes on at the time but the nail still went in fairly deep.  They have kept it covered with a bandage since incident.  He states the pain is an 8 out of 10, no numbness, tingling, significant uncontrolled bleeding, decreased range of motion.   Last tetanus shot was 01/23/2020.  She also states he is out of his blood pressure and cholesterol medications and could not get in with his PCP in time to get refills prior to the holiday weekend.  Tolerates both well without side effects.  Home blood pressures 120s over 70s consistently.  Denies chest pain, shortness of breath, headaches, dizziness.   Past Medical History:  Diagnosis Date   Allergy    Hyperlipidemia    Hypertension    Neuromuscular disorder [facility])     Patient Active Problem List   Diagnosis Date Noted   Essential hypertension 04/25/2018    History reviewed. No pertinent surgical history.   Home Medications    Prior to Admission medications   Medication Sig Start Date End Date Taking? Authorizing Provider  amoxicillin-clavulanate (AUGMENTIN) 875-125 MG tablet Take 1 tablet by mouth every 12 (twelve) hours. 03/15/21  Yes Particia Nearing, PA-C  chlorhexidine (HIBICLENS) 4 % external liquid Apply topically daily as needed. 03/15/21  Yes Particia Nearing, PA-C  ampicillin (PRINCIPEN) 500 MG capsule Take 500 mg by mouth 2 (two) times daily.    [provider]  cetirizine (ZYRTEC ALLERGY) 10 MG tablet Take 1 tablet (10 mg total) by mouth daily. 04/20/20   Moshe Cipro, NP  diclofenac (VOLTAREN) 75 MG EC tablet TAKE 1 TABLET(75 MG)  BY MOUTH TWICE DAILY 03/28/20   Janeece Agee, NP  hydrOXYzine (ATARAX/VISTARIL) 25 MG tablet Take 25 mg by mouth daily. Patient not taking: Reported on 03/27/2020 12/05/15   [provider]  losartan (COZAAR) 50 MG tablet Take 1 tablet (50 mg total) by mouth daily. 03/15/21   Particia Nearing, PA-C  meclizine (ANTIVERT) 12.5 MG tablet Take 1 tablet (12.5 mg total) by mouth 3 (three) times daily as needed for dizziness. 01/23/20   Janeece Agee, NP  methocarbamol (ROBAXIN) 500 MG tablet TAKE 1 TABLET(500 MG) BY MOUTH FOUR TIMES DAILY 03/28/20   Janeece Agee, NP  predniSONE (STERAPRED UNI-PAK 21 TAB) 10 MG (21) TBPK tablet Take per package instructions. Do not skip doses. Finish entire pack. 01/09/20   Janeece Agee, NP  rosuvastatin (CRESTOR) 10 MG tablet Take 1 tablet (10 mg total) by mouth daily. 03/15/21   Particia Nearing, PA-C  triamcinolone cream (KENALOG) 0.1 % Apply 1 application topically 2 (two) times daily. 04/20/20   Moshe Cipro, NP    Family History Family History  Problem Relation Age of Onset   Colon cancer Neg Hx    Colon polyps Neg Hx    Esophageal cancer Neg Hx    Rectal cancer Neg Hx    Stomach cancer Neg Hx     Social History Social History   Tobacco Use   Smoking status: Never   Smokeless tobacco: Never  Vaping Use   Vaping Use: Never used  Substance Use Topics   Alcohol use: Yes    Alcohol/week: 1.0 standard drink    Types: 1 Cans of beer per week    Comment: special occasions   Drug use: No     Allergies   Gadolinium derivatives   Review of Systems Review of Systems Per HPI  Physical Exam Triage Vital Signs ED Triage Vitals  Enc Vitals Group     BP 03/15/21 1143 127/72     Pulse Rate 03/15/21 1143 63     Resp 03/15/21 1143 17     Temp 03/15/21 1143 98.3 F (36.8 C)     Temp Source 03/15/21 1143 Oral     SpO2 03/15/21 1143 97 %     Weight --      Height --      Head Circumference --      Peak Flow --       Pain Score 03/15/21 1141 8     Pain Loc --      Pain Edu? --      Excl. in GC? --    No data found.  Updated Vital Signs BP 127/72 (BP Location: Left Arm)   Pulse 63   Temp 98.3 F (36.8 C) (Oral)   Resp 17   SpO2 97%   Visual Acuity Right Eye Distance:   Left Eye Distance:   Bilateral Distance:    Right Eye Near:   Left Eye Near:    Bilateral Near:     Physical Exam Vitals and nursing note reviewed.  Constitutional:      Appearance: Normal appearance.  HENT:     Head: Atraumatic.     Mouth/Throat:     Mouth: Mucous membranes are moist.     Pharynx: Oropharynx is clear.  Eyes:     Extraocular Movements: Extraocular movements intact.     Conjunctiva/sclera: Conjunctivae normal.  Cardiovascular:     Rate and Rhythm: Normal rate and regular rhythm.  Pulmonary:     Effort: Pulmonary effort is normal.     Breath sounds: Normal breath sounds.  Abdominal:     General: Bowel sounds are normal. There is no distension.     Palpations: Abdomen is soft.     Tenderness: There is no abdominal tenderness. There is no guarding.  Musculoskeletal:     Cervical back: Normal range of motion and neck supple.     Comments: In wheelchair due to pain with weightbearing on right foot from puncture.  Otherwise range of motion the normal limits.  Skin:    General: Skin is warm.     Comments: Puncture wound to plantar surface of right foot below base of first and second digit.  Slow oozing of sanguinous fluid with pressure applied.  No foreign body, good range of motion of all 5 digits, no diffuse swelling or erythema.  Neurological:     General: No focal deficit present.     Mental Status: He is oriented to person, place, and time.     Comments: Right lower extremity neurovascularly intact  Psychiatric:        Mood and Affect: Mood normal.        Thought Content: Thought content normal.        Judgment: Judgment normal.   UC Treatments / Results  Labs (all labs ordered are listed,  but only abnormal results are displayed) Labs Reviewed - No data to display  EKG  Radiology No results found.  Procedures Procedures (including critical care time)  Medications Ordered in UC Medications - No data to display  Initial Impression / Assessment and Plan / UC Course  I have reviewed the triage vital signs and the nursing notes.  Pertinent labs & imaging results that were available during my care of the patient were reviewed by me and considered in my medical decision making (see chart for details).     Wound cleaned with Hibiclens, no foreign body noted on exam or evidence of ligament or tendon damage.  Area dressed with nonstick gauze, Coban wrap, home wound care reviewed at length.  Augmentin, Hibiclens sent to the pharmacy.  Return precautions given for signs of infection.  Tdap up-to-date.  Losartan and Crestor refilled, close follow-up with PCP for recheck and further refills recommended.  Final Clinical Impressions(s) / UC Diagnoses   Final diagnoses:  Puncture wound  Right foot pain  Essential hypertension  Hyperlipidemia, unspecified hyperlipidemia type  Mixed hyperlipidemia   Discharge Instructions   None    ED Prescriptions     Medication Sig Dispense Auth. Provider   amoxicillin-clavulanate (AUGMENTIN) 875-125 MG tablet Take 1 tablet by mouth every 12 (twelve) hours. 14 tablet Particia Nearing, [place]   chlorhexidine (HIBICLENS) 4 % external liquid Apply topically daily as needed. 120 mL Particia Nearing, PA-C   losartan (COZAAR) 50 MG tablet Take 1 tablet (50 mg total) by mouth daily. 90 tablet Particia Nearing, [place]   rosuvastatin (CRESTOR) 10 MG tablet Take 1 tablet (10 mg total) by mouth daily. 90 tablet Particia Nearing, [place]      PDMP not reviewed this encounter.   Particia Nearing, [place] 03/15/21 1219

## 2021-03-15 NOTE — ED Triage Notes (Addendum)
Pt presents with puncture on right foot. Daughter states stepped on a nail in garden this morning. States had shoes on and nail was not rusted.   States also needs refill of BP medication. Has been without medication for 1 week. Daughter states PCP office closed and currently trying to find a new PCP.

## 2021-04-14 ENCOUNTER — Other Ambulatory Visit: Payer: Self-pay

## 2021-04-14 ENCOUNTER — Ambulatory Visit (INDEPENDENT_AMBULATORY_CARE_PROVIDER_SITE_OTHER): Payer: 59

## 2021-04-14 ENCOUNTER — Ambulatory Visit (HOSPITAL_COMMUNITY)
Admission: EM | Admit: 2021-04-14 | Discharge: 2021-04-14 | Disposition: A | Discharge status: Home / Self Care | Payer: 59 | Attending: Internal Medicine | Admitting: Internal Medicine

## 2021-04-14 ENCOUNTER — Encounter (HOSPITAL_COMMUNITY): Payer: Self-pay

## 2021-04-14 DIAGNOSIS — J189 Pneumonia, unspecified organism: Secondary | ICD-10-CM | POA: Diagnosis present

## 2021-04-14 DIAGNOSIS — R0602 Shortness of breath: Secondary | ICD-10-CM | POA: Diagnosis present

## 2021-04-14 DIAGNOSIS — Z20822 Contact with and (suspected) exposure to covid-19: Secondary | ICD-10-CM | POA: Diagnosis not present

## 2021-04-14 DIAGNOSIS — R059 Cough, unspecified: Secondary | ICD-10-CM | POA: Diagnosis not present

## 2021-04-14 LAB — SARS CORONAVIRUS 2 (TAT 6-24 HRS): SARS Coronavirus 2: NEGATIVE

## 2021-04-14 MED ORDER — AMOXICILLIN-POT CLAVULANATE 875-125 MG PO TABS: 1.0000 | ORAL_TABLET | Freq: Two times a day (BID) | ORAL | 0 refills | Status: AC

## 2021-04-14 MED ORDER — AZITHROMYCIN 500 MG PO TABS: 500.0000 mg | ORAL_TABLET | Freq: Every day | ORAL | 0 refills | Status: AC

## 2021-04-14 MED ORDER — BENZONATATE 100 MG PO CAPS: 100.0000 mg | ORAL_CAPSULE | Freq: Three times a day (TID) | ORAL | 0 refills | Status: DC | PRN

## 2021-04-14 MED ORDER — PREDNISONE 10 MG (21) PO TBPK: ORAL_TABLET | Freq: Every day | ORAL | 0 refills | Status: DC

## 2021-04-14 NOTE — Discharge Instructions (Addendum)
Your x-ray showing signs of community-acquired pneumonia.  Please take both antibiotics as prescribed as well as prednisone steroid to decrease inflammation and help with cough.  We have also prescribed a cough medication to take as needed for cough.  Your COVID-19 test results are pending.  We will call if these are positive.

## 2021-04-14 NOTE — ED Provider Notes (Signed)
[facility]    CSN: [id number] Arrival date & time: 04/14/21  1004      History   Chief Complaint Chief Complaint  Patient presents with  . Neck Pain  . Headache  . Cough    HPI Nicolas Thompson is a 60 y.o. male.   Patient presents with 1 day history of cough, intermittent headaches, intermittent shortness of breath for 1 week.  Denies any upper respiratory symptoms or sore throat.  States that he "felt feverish" but denies taking temperature with thermometer.  Denies any chest pain.  Patient states that headaches occur due to excessive coughing.  Has taken NyQuil, DayQuil, and Tylenol over-the-counter with minimal relief of symptoms.  Patient was seen at different urgent care approximately 4 days ago where he was prescribed amoxicillin and tested negative for COVID-19.  Patient states that he has not seen any improvement with the amoxicillin treatment.   Neck Pain Headache Cough  Past Medical History:  Diagnosis Date  . Allergy   . Hyperlipidemia   . Hypertension   . Neuromuscular disorder [facility](HCC)     Patient Active Problem List   Diagnosis Date Noted  . Essential hypertension 04/25/2018    History reviewed. No pertinent surgical history.     Home Medications    Prior to Admission medications   Medication Sig Start Date End Date Taking? Authorizing Provider  amoxicillin-clavulanate (AUGMENTIN) 875-125 MG tablet Take 1 tablet by mouth every 12 (twelve) hours for 10 days. 04/14/21 04/24/21 Yes Lance MussFowler, Avaree Gilberti E, FNP  azithromycin (ZITHROMAX) 500 MG tablet Take 1 tablet (500 mg total) by mouth daily for 5 days. 04/14/21 04/19/21 Yes Lance MussFowler, Simranjit Thayer E, FNP  benzonatate (TESSALON) 100 MG capsule Take 1 capsule (100 mg total) by mouth every 8 (eight) hours as needed for cough. 04/14/21  Yes Lance MussFowler, Patrese Neal E, FNP  predniSONE (STERAPRED UNI-PAK 21 TAB) 10 MG (21) TBPK tablet Take by mouth daily. Take 6 tabs by mouth daily  for 2 days, then 5 tabs for 2 days, then 4 tabs for 2  days, then 3 tabs for 2 days, 2 tabs for 2 days, then 1 tab by mouth daily for 2 days 04/14/21  Yes Lance MussFowler, Elson Ulbrich E, FNP  ampicillin (PRINCIPEN) 500 MG capsule Take 500 mg by mouth 2 (two) times daily.    [provider]  cetirizine (ZYRTEC ALLERGY) 10 MG tablet Take 1 tablet (10 mg total) by mouth daily. 04/20/20   Moshe CiproMatthews, Stephanie, NP  chlorhexidine (HIBICLENS) 4 % external liquid Apply topically daily as needed. 03/15/21   Particia NearingLane, Rachel Elizabeth, PA-C  diclofenac (VOLTAREN) 75 MG EC tablet TAKE 1 TABLET(75 MG) BY MOUTH TWICE DAILY 03/28/20   Janeece AgeeMorrow, Richard, NP  hydrOXYzine (ATARAX/VISTARIL) 25 MG tablet Take 25 mg by mouth daily. Patient not taking: No sig reported 12/05/15   [provider]  losartan (COZAAR) 50 MG tablet Take 1 tablet (50 mg total) by mouth daily. 03/15/21   Particia NearingLane, Rachel Elizabeth, PA-C  meclizine (ANTIVERT) 12.5 MG tablet Take 1 tablet (12.5 mg total) by mouth 3 (three) times daily as needed for dizziness. 01/23/20   Janeece AgeeMorrow, Richard, NP  methocarbamol (ROBAXIN) 500 MG tablet TAKE 1 TABLET(500 MG) BY MOUTH FOUR TIMES DAILY 03/28/20   Janeece AgeeMorrow, Richard, NP  rosuvastatin (CRESTOR) 10 MG tablet Take 1 tablet (10 mg total) by mouth daily. 03/15/21   Particia NearingLane, Rachel Elizabeth, PA-C  triamcinolone cream (KENALOG) 0.1 % Apply 1 application topically 2 (two) times daily. 04/20/20   Moshe CiproMatthews, Stephanie,  NP    Family History Family History  Problem Relation Age of Onset  . Colon cancer Neg Hx   . Colon polyps Neg Hx   . Esophageal cancer Neg Hx   . Rectal cancer Neg Hx   . Stomach cancer Neg Hx     Social History Social History   Tobacco Use  . Smoking status: Never  . Smokeless tobacco: Never  Vaping Use  . Vaping Use: Never used  Substance Use Topics  . Alcohol use: Yes    Alcohol/week: 1.0 standard drink    Types: 1 Cans of beer per week    Comment: special occasions  . Drug use: No     Allergies   Gadolinium derivatives   Review of Systems Review of  Systems Per HPI  Physical Exam Triage Vital Signs ED Triage Vitals  Enc Vitals Group     BP 04/14/21 1031 123/73     Pulse Rate 04/14/21 1031 87     Resp 04/14/21 1031 20     Temp 04/14/21 1031 98.5 F (36.9 C)     Temp Source 04/14/21 1031 Oral     SpO2 04/14/21 1031 100 %     Weight --      Height --      Head Circumference --      Peak Flow --      Pain Score 04/14/21 1033 10     Pain Loc --      Pain Edu? --      Excl. in GC? --    No data found.  Updated Vital Signs BP 123/73 (BP Location: Right Arm)   Pulse 87   Temp 98.5 F (36.9 C) (Oral)   Resp 20   SpO2 100%   Visual Acuity Right Eye Distance:   Left Eye Distance:   Bilateral Distance:    Right Eye Near:   Left Eye Near:    Bilateral Near:     Physical Exam Constitutional:      General: He is not in acute distress.    Appearance: Normal appearance. He is not toxic-appearing.  HENT:     Head: Normocephalic and atraumatic.     Right Ear: Tympanic membrane and ear canal normal.     Left Ear: Tympanic membrane and ear canal normal.     Nose: Nose normal.     Mouth/Throat:     Mouth: Mucous membranes are moist.     Pharynx: Oropharynx is clear. No posterior oropharyngeal erythema.  Eyes:     Extraocular Movements: Extraocular movements intact.     Conjunctiva/sclera: Conjunctivae normal.  Cardiovascular:     Rate and Rhythm: Normal rate and regular rhythm.     Heart sounds: Normal heart sounds.  Pulmonary:     Effort: Pulmonary effort is normal. No respiratory distress.     Breath sounds: Normal breath sounds. No wheezing, rhonchi or rales.     Comments: Harsh cough on exam Skin:    General: Skin is warm and dry.  Neurological:     General: No focal deficit present.     Mental Status: He is alert and oriented to person, place, and time. Mental status is at baseline.     Cranial Nerves: Cranial nerves are intact.     Sensory: Sensation is intact.     Motor: Motor function is intact.      Coordination: Coordination is intact.     Gait: Gait is intact.  Psychiatric:  Mood and Affect: Mood normal.        Behavior: Behavior normal.        Thought Content: Thought content normal.        Judgment: Judgment normal.     UC Treatments / Results  Labs (all labs ordered are listed, but only abnormal results are displayed) Labs Reviewed  SARS CORONAVIRUS 2 (TAT 6-24 HRS)    EKG   Radiology DG Chest 2 View  Result Date: 04/14/2021 CLINICAL DATA:  Cough with SOB x1 week EXAM: CHEST - 2 VIEW COMPARISON:  Chest radiograph 10/09/2018 FINDINGS: Stable cardiomediastinal contours. There are new subtle linear opacities in the left mid lung. Right lung is clear. No pneumothorax or pleural effusion. No acute finding in the visualized skeleton. IMPRESSION: New subtle linear opacities in the left mid lung may represent atelectasis or early infection. Electronically Signed   By: Emmaline Kluver M.D.   On: 04/14/2021 11:34    Procedures Procedures (including critical care time)  Medications Ordered in UC Medications - No data to display  Initial Impression / Assessment and Plan / UC Course  I have reviewed the triage vital signs and the nursing notes.  Pertinent labs & imaging results that were available during my care of the patient were reviewed by me and considered in my medical decision making (see chart for details).     Chest x-ray showing possible signs of community-acquired pneumonia with opacities that could indicate "early infection".  COVID-19 retest pending.  Discontinued amoxicillin antibiotic and prescribed Augmentin and azithromycin to cover for community-acquired pneumonia.  Patient is nontoxic-appearing and does not need immediate medical attention at hospital at this time.  Will add prednisone taper to help with cough and shortness of breath.  Also prescribed benzonatate to take as needed for cough.  Advised patient to go to the hospital if shortness of breath  worsens.  Discussed over-the-counter medications for cough and cold as well.Discussed strict return precautions. Patient verbalized understanding and is agreeable with plan.  Interpreter used throughout patient interaction. Final Clinical Impressions(s) / UC Diagnoses   Final diagnoses:  Cough  Shortness of breath  Community acquired pneumonia, unspecified laterality     Discharge Instructions      Your x-ray showing signs of community-acquired pneumonia.  Please take both antibiotics as prescribed as well as prednisone steroid to decrease inflammation and help with cough.  We have also prescribed a cough medication to take as needed for cough.  Your COVID-19 test results are pending.  We will call if these are positive.     ED Prescriptions     Medication Sig Dispense Auth. Provider   predniSONE (STERAPRED UNI-PAK 21 TAB) 10 MG (21) TBPK tablet Take by mouth daily. Take 6 tabs by mouth daily  for 2 days, then 5 tabs for 2 days, then 4 tabs for 2 days, then 3 tabs for 2 days, 2 tabs for 2 days, then 1 tab by mouth daily for 2 days 42 tablet Lance Muss, FNP   benzonatate (TESSALON) 100 MG capsule Take 1 capsule (100 mg total) by mouth every 8 (eight) hours as needed for cough. 21 capsule Lance Muss, FNP   amoxicillin-clavulanate (AUGMENTIN) 875-125 MG tablet Take 1 tablet by mouth every 12 (twelve) hours for 10 days. 20 tablet Lance Muss, FNP   azithromycin (ZITHROMAX) 500 MG tablet Take 1 tablet (500 mg total) by mouth daily for 5 days. 5 tablet Lance Muss, FNP  PDMP not reviewed this encounter.   Lance Muss, FNP 04/14/21 1230

## 2021-04-14 NOTE — ED Triage Notes (Signed)
Pt reports cough, pain in the back of the head, shortness of breath x 1 week. Reports a Doctor prescribed amoxicillin, he started 3 days ago, gives no relief.

## 2021-08-07 IMAGING — DX DG CERVICAL SPINE COMPLETE 4+V
6 series · 6 of 6 positions shown · non-contrast
Comparison: None.

CLINICAL DATA: Worsening neck pain

EXAM:
CERVICAL SPINE - COMPLETE 4+ VIEW

[c-spine lat]
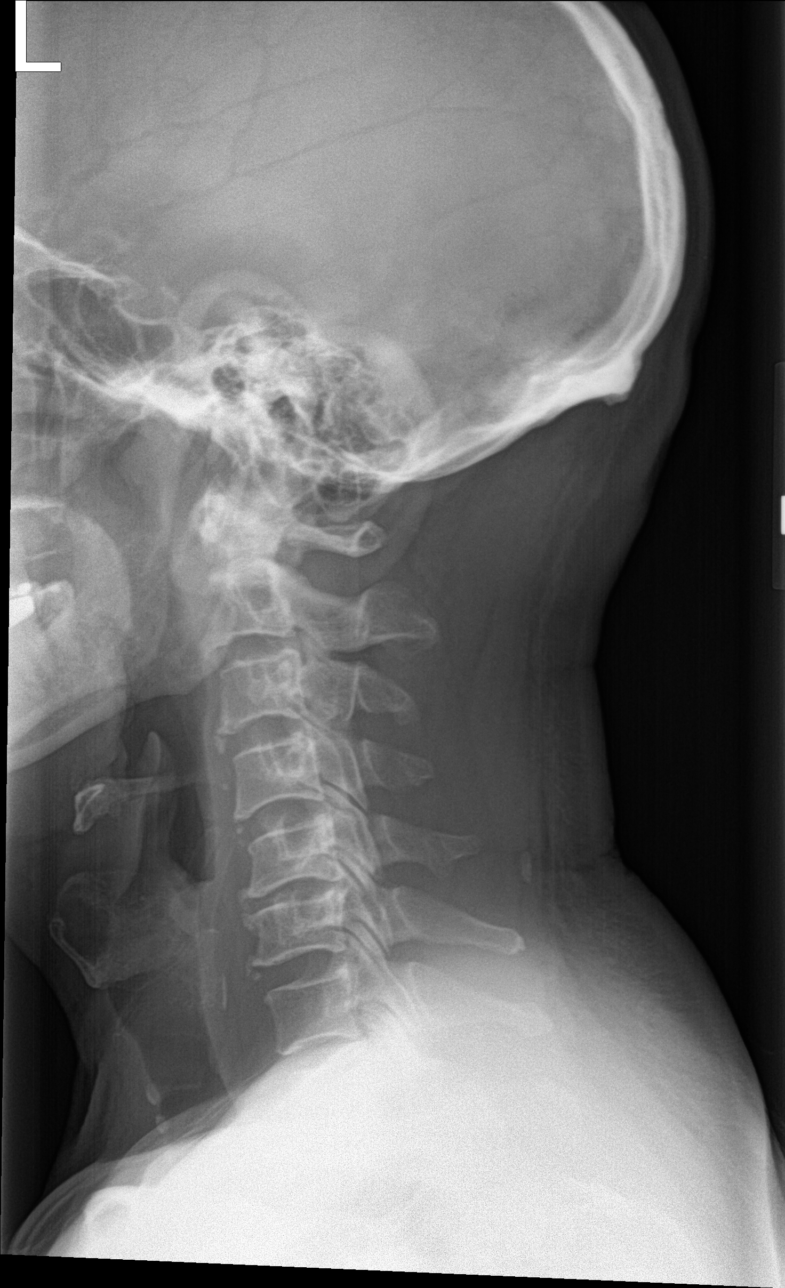

[c-spine obl (1 of 2)]
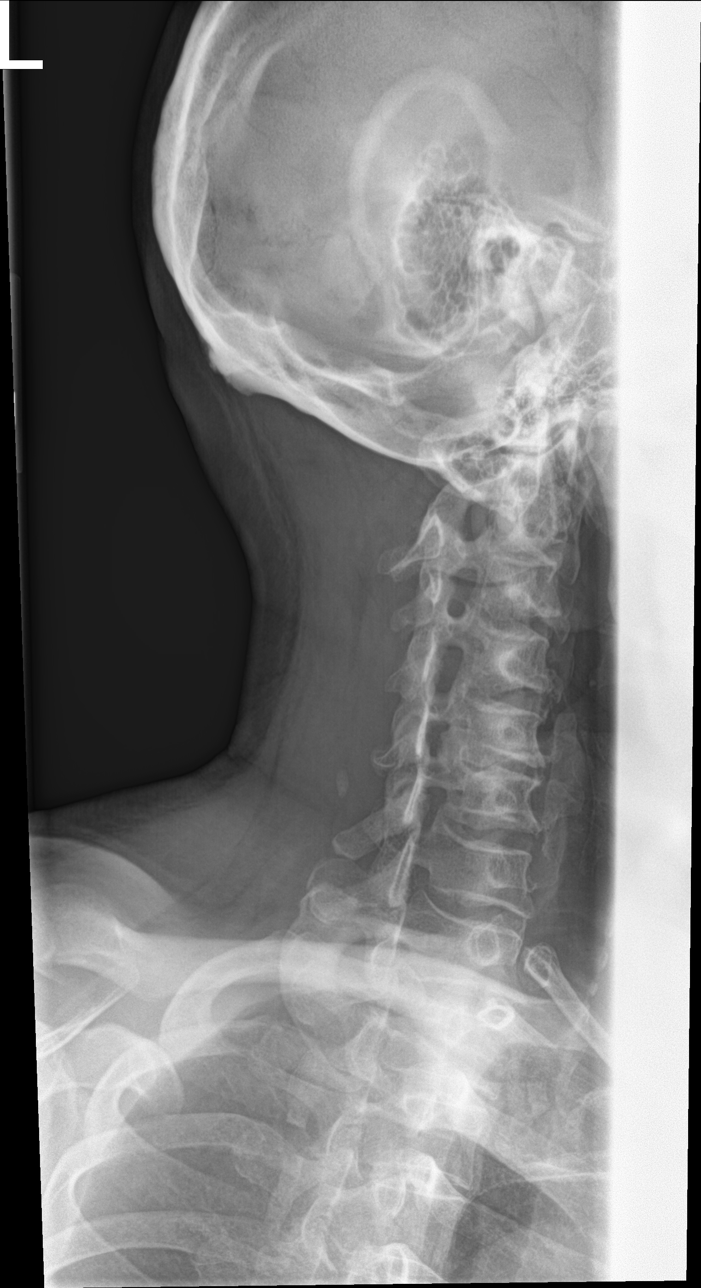

[c-spine obl (2 of 2)]
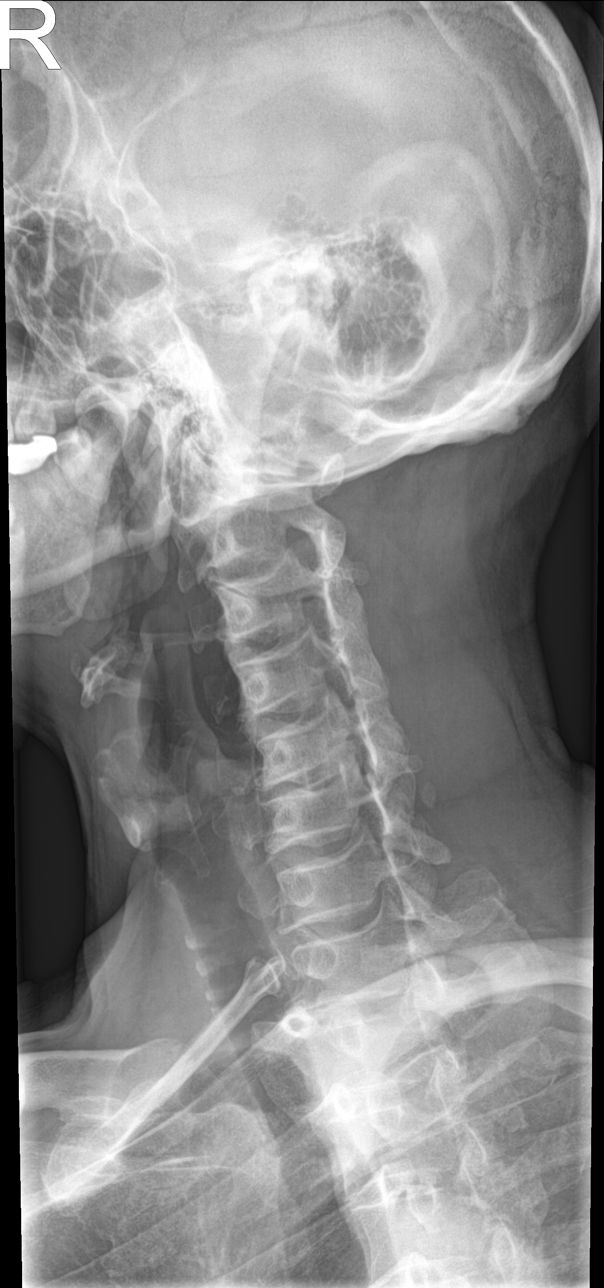

[c-spine ap]
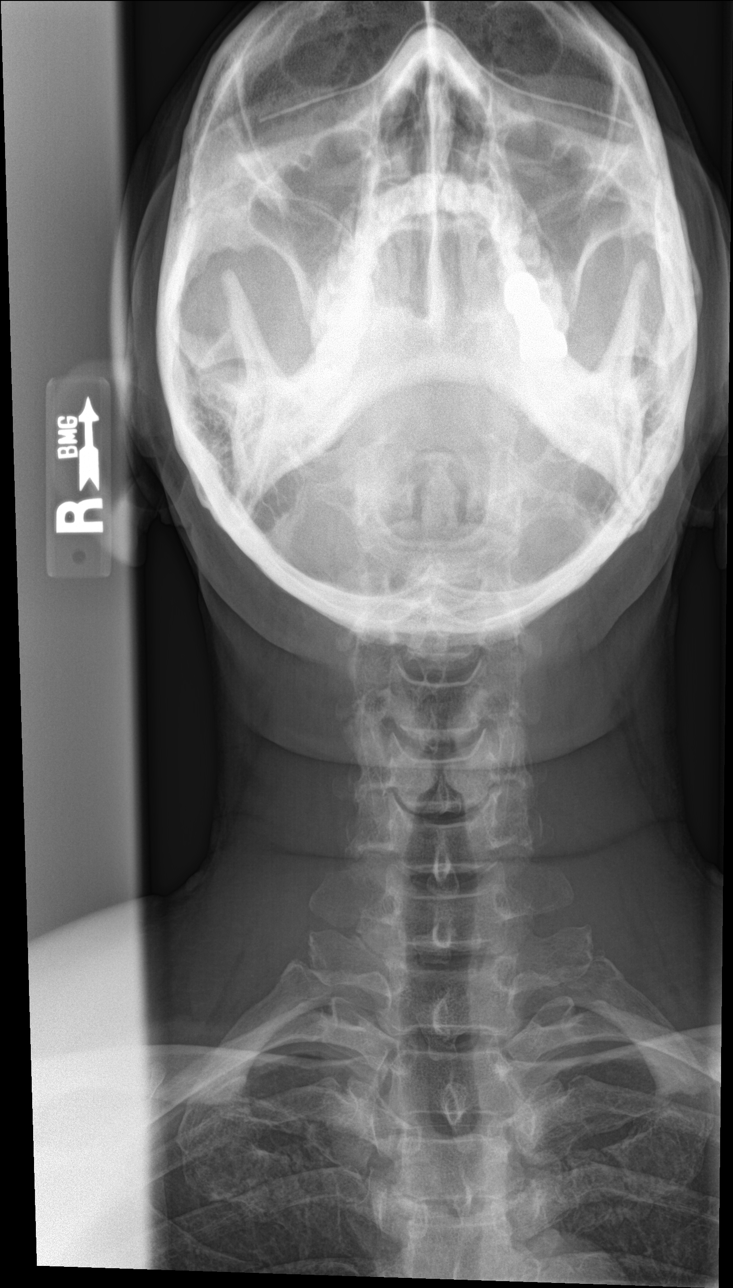

[c-spine open mouth]
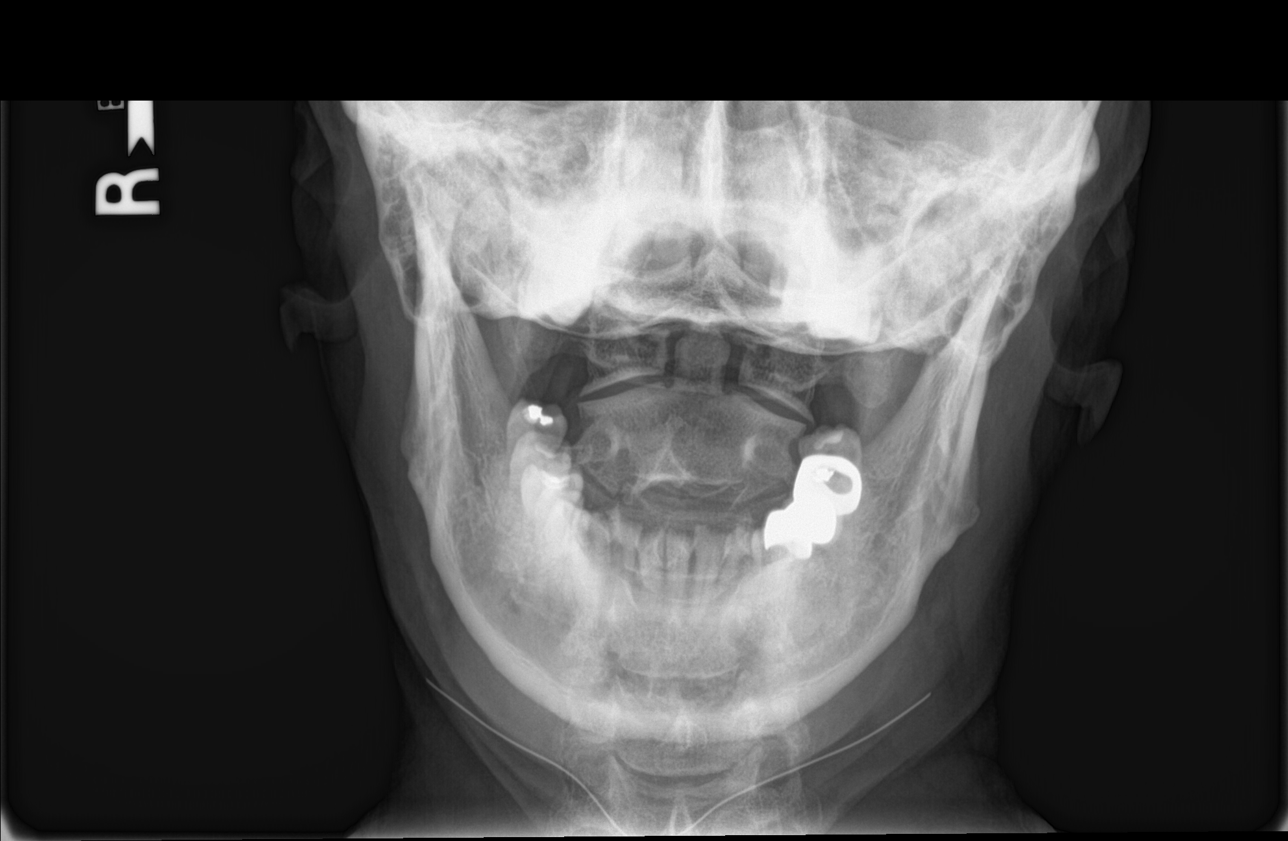

[c-spine swimmers]
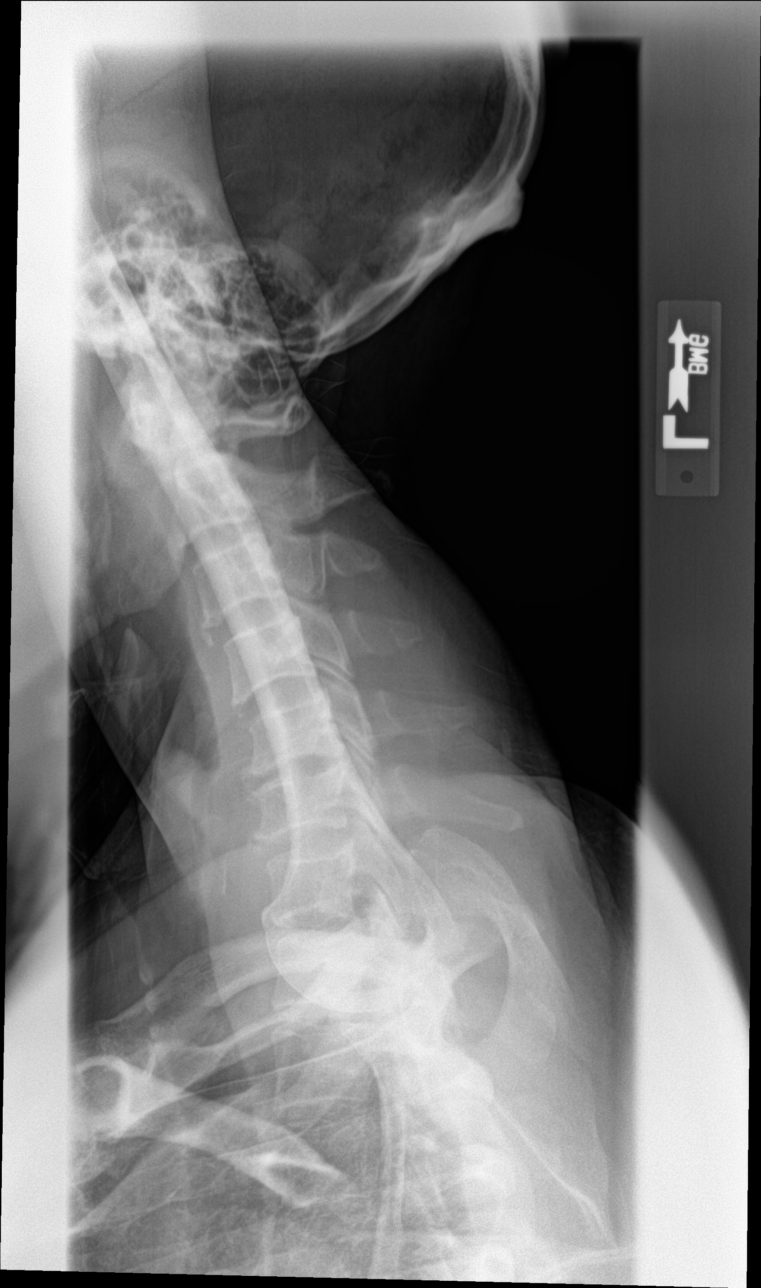

[6 of 6 positions shown; findings below may reference images not displayed]

FINDINGS: Normal alignment of the cervical vertebral bodies. Normal spinal
laminal line. Mild endplate spurring. Oblique projections
demonstrate neural foraminal narrowing on the LEFT at C4-C5. Open
mouth odontoid views are normal.
IMPRESSION: 1. No acute findings of the cervical spine.
2. Mild disc osteophytic disease.
3. Neural foraminal narrowing on the LEFT at C4-C5.

## 2021-12-16 ENCOUNTER — Ambulatory Visit (HOSPITAL_COMMUNITY)
Admission: EM | Admit: 2021-12-16 | Discharge: 2021-12-16 | Disposition: A | Discharge status: Home / Self Care | Payer: 59 | Attending: Family Medicine | Admitting: Family Medicine

## 2021-12-16 ENCOUNTER — Other Ambulatory Visit: Payer: Self-pay

## 2021-12-16 ENCOUNTER — Encounter (HOSPITAL_COMMUNITY): Payer: Self-pay | Admitting: *Deleted

## 2021-12-16 DIAGNOSIS — E782 Mixed hyperlipidemia: Secondary | ICD-10-CM

## 2021-12-16 DIAGNOSIS — R1013 Epigastric pain: Secondary | ICD-10-CM

## 2021-12-16 MED ORDER — ALUM & MAG HYDROXIDE-SIMETH 200-200-20 MG/5ML PO SUSP
30.0000 mL | Freq: Once | ORAL | Status: AC
  Administered 2021-12-16: 30 mL via ORAL

## 2021-12-16 MED ORDER — LIDOCAINE VISCOUS HCL 2 % MT SOLN
15.0000 mL | Freq: Once | OROMUCOSAL | Status: AC
  Administered 2021-12-16: 15 mL via ORAL

## 2021-12-16 MED ORDER — LIDOCAINE VISCOUS HCL 2 % MT SOLN
OROMUCOSAL | Status: AC
  Filled 2021-12-16: qty 15

## 2021-12-16 MED ORDER — ROSUVASTATIN CALCIUM 10 MG PO TABS: 10.0000 mg | ORAL_TABLET | Freq: Every day | ORAL | 0 refills | Status: AC

## 2021-12-16 MED ORDER — PANTOPRAZOLE SODIUM 40 MG PO TBEC: 40.0000 mg | DELAYED_RELEASE_TABLET | Freq: Every day | ORAL | 0 refills | Status: AC

## 2021-12-16 MED ORDER — ALUM & MAG HYDROXIDE-SIMETH 200-200-20 MG/5ML PO SUSP
ORAL | Status: AC
  Filled 2021-12-16: qty 30

## 2021-12-16 NOTE — Discharge Instructions (Addendum)
-   Please start daily acid reflux medication on an empty stomach  ?- If the pain persists despite treatment, please follow up with primary care provider  ?- I have sent a refill of your cholesterol medication to the pharmacy.  Please follow up with primary care provider to monitor the effectiveness of this medication ?

## 2021-12-16 NOTE — ED Provider Notes (Signed)
?[facility] ? ? ? ?CSN: [id number] ?Arrival date & time: 12/16/21  1019 ? ? ?  ? ?History   ?Chief Complaint ?Chief Complaint  ?Patient presents with  ? Abdominal Pain  ? ? ?HPI ?Kyland No is a 61 y.o. male.  ? ?Presents to urgent care today with daughter who is serving as a Ecologist.  He declines alternate medical interpreter today.   Reports epigastric pain that began on Sunday upon waking up.  Reports the pain is severe and has slowly improved since yesterday.  Denies fever, shortness of breath, diaphoresis, nausea/vomiting, change in bowel movements, and blood in his stool.  He has not been eating as much as normal because of the pain. Has been taking a powder medication from [place] that has helped with pain as well as Pepto Bismol.   ? ?Daughter reports he is almost out of his rosuvastatin and he is requesting a refill today.  Reports he is tolerating this medication well.  ? ? ?Past Medical History:  ?Diagnosis Date  ? Allergy   ? Hyperlipidemia   ? Hypertension   ? Neuromuscular disorder (HCC)   ? ? ?Patient Active Problem List  ? Diagnosis Date Noted  ? Essential hypertension 04/25/2018  ? ? ?History reviewed. No pertinent surgical history. ? ? ? ? ?Home Medications   ? ?Prior to Admission medications   ?Medication Sig Start Date End Date Taking? Authorizing Provider  ?pantoprazole (PROTONIX) 40 MG tablet Take 1 tablet (40 mg total) by mouth daily. Take daily 30 minutes before first meal of the day 12/16/21  Yes Valentino Nose, NP  ?cetirizine (ZYRTEC ALLERGY) 10 MG tablet Take 1 tablet (10 mg total) by mouth daily. 04/20/20   Moshe Cipro, NP  ?diclofenac (VOLTAREN) 75 MG EC tablet TAKE 1 TABLET(75 MG) BY MOUTH TWICE DAILY 03/28/20   Janeece Agee, NP  ?losartan (COZAAR) 50 MG tablet Take 1 tablet (50 mg total) by mouth daily. 03/15/21   Particia Nearing, PA-C  ?meclizine (ANTIVERT) 12.5 MG tablet Take 1 tablet (12.5 mg total) by mouth 3 (three) times daily as needed for  dizziness. 01/23/20   Janeece Agee, NP  ?methocarbamol (ROBAXIN) 500 MG tablet TAKE 1 TABLET(500 MG) BY MOUTH FOUR TIMES DAILY 03/28/20   Janeece Agee, NP  ?rosuvastatin (CRESTOR) 10 MG tablet Take 1 tablet (10 mg total) by mouth daily. 12/16/21   Valentino Nose, NP  ?triamcinolone cream (KENALOG) 0.1 % Apply 1 application topically 2 (two) times daily. 04/20/20   Moshe Cipro, NP  ? ? ?Family History ?Family History  ?Problem Relation Age of Onset  ? Colon cancer Neg Hx   ? Colon polyps Neg Hx   ? Esophageal cancer Neg Hx   ? Rectal cancer Neg Hx   ? Stomach cancer Neg Hx   ? ? ?Social History ?Social History  ? ?Tobacco Use  ? Smoking status: Never  ? Smokeless tobacco: Never  ?Vaping Use  ? Vaping Use: Never used  ?Substance Use Topics  ? Alcohol use: Yes  ?  Alcohol/week: 1.0 standard drink  ?  Types: 1 Cans of beer per week  ?  Comment: special occasions  ? Drug use: No  ? ? ? ?Allergies   ?Gadolinium derivatives ? ? ?Review of Systems ?Review of Systems ?Per HPI ? ?Physical Exam ?Triage Vital Signs ?ED Triage Vitals  ?Enc Vitals Group  ?   BP 12/16/21 1204 114/70  ?   Pulse Rate 12/16/21 1204 61  ?  Resp 12/16/21 1204 18  ?   Temp 12/16/21 1204 98 ?F (36.7 ?C)  ?   Temp src --   ?   SpO2 12/16/21 1204 98 %  ?   Weight --   ?   Height --   ?   Head Circumference --   ?   Peak Flow --   ?   Pain Score 12/16/21 1207 8  ?   Pain Loc --   ?   Pain Edu? --   ?   Excl. in GC? --   ? ?No data found. ? ?Updated Vital Signs ?BP 114/70   Pulse 61   Temp 98 ?F (36.7 ?C)   Resp 18   SpO2 98%  ? ?Visual Acuity ?Right Eye Distance:   ?Left Eye Distance:   ?Bilateral Distance:   ? ?Right Eye Near:   ?Left Eye Near:    ?Bilateral Near:    ? ?Physical Exam ?Vitals and nursing note reviewed.  ?Constitutional:   ?   General: He is not in acute distress. ?   Appearance: He is well-developed. He is not toxic-appearing.  ?HENT:  ?   Head: Normocephalic and atraumatic.  ?   Mouth/Throat:  ?   Mouth: Mucous membranes  are moist.  ?   Pharynx: No pharyngeal swelling or oropharyngeal exudate.  ?Eyes:  ?   General: No scleral icterus. ?   Extraocular Movements: Extraocular movements intact.  ?Cardiovascular:  ?   Rate and Rhythm: Normal rate and regular rhythm.  ?Pulmonary:  ?   Effort: Pulmonary effort is normal. No respiratory distress.  ?   Breath sounds: Normal breath sounds. No wheezing, rhonchi or rales.  ?Abdominal:  ?   General: Abdomen is flat. Bowel sounds are decreased.  ?   Palpations: Abdomen is soft. There is no hepatomegaly, splenomegaly or mass.  ?   Tenderness: There is abdominal tenderness in the epigastric area. There is no right CVA tenderness, left CVA tenderness or guarding. Negative signs include Murphy's sign and McBurney's sign.  ?   Hernia: A hernia is present. Hernia is present in the ventral area.  ?Neurological:  ?   Mental Status: He is alert.  ? ? ? ?UC Treatments / Results  ?Labs ?(all labs ordered are listed, but only abnormal results are displayed) ?Labs Reviewed - No data to display ? ?EKG ? ? ?Radiology ?No results found. ? ?Procedures ?Procedures (including critical care time) ? ?Medications Ordered in UC ?Medications  ?alum & mag hydroxide-simeth (MAALOX/MYLANTA) 200-200-20 MG/5ML suspension 30 mL (30 mLs Oral Given 12/16/21 1336)  ?  And  ?lidocaine (XYLOCAINE) 2 % viscous mouth solution 15 mL (15 mLs Oral Given 12/16/21 1336)  ? ? ?Initial Impression / Assessment and Plan / UC Course  ?I have reviewed the triage vital signs and the nursing notes. ? ?Pertinent labs & imaging results that were available during my care of the patient were reviewed by me and considered in my medical decision making (see chart for details). ? ?  ?Suspect epigastric pain is related to GERD.  No red flags in history or on examination.  GI cocktail given today in urgent care with relief of symptoms.  Start daily PPI on an empty stomach for 6 weeks.  If pain does not improve on medication, follow up with PCP.  Discussed  incidental hernia on exam - at this point, I do think this is contributing to pain.  If pain persists despite medication, follow up with  PCP.   ? ?Refill given for rosuvastatin as he is out.  Discussed close follow up with PCP for monitoring of this medication and lipid levels.  ?Final Clinical Impressions(s) / UC Diagnoses  ? ?Final diagnoses:  ?Abdominal pain, epigastric  ? ? ? ?Discharge Instructions   ? ?  ?- Please start daily acid reflux medication on an empty stomach  ?- If the pain persists despite treatment, please follow up with primary care provider  ?- I have sent a refill of your cholesterol medication to the pharmacy.  Please follow up with primary care provider to monitor the effectiveness of this medication ? ? ? ? ?ED Prescriptions   ? ? Medication Sig Dispense Auth. Provider  ? rosuvastatin (CRESTOR) 10 MG tablet Take 1 tablet (10 mg total) by mouth daily. 90 tablet Cathlean MarseillesMartinez, Roza Creamer A, NP  ? pantoprazole (PROTONIX) 40 MG tablet Take 1 tablet (40 mg total) by mouth daily. Take daily 30 minutes before first meal of the day 60 tablet Valentino NoseMartinez, Gaudencio Chesnut A, NP  ? ?  ? ?PDMP not reviewed this encounter. ?  ?Valentino NoseMartinez, Lejend Dalby A, NP ?12/16/21 1347 ? ?

## 2021-12-16 NOTE — ED Triage Notes (Signed)
FamiIy reports Pt has had epigastric pain for over one week.  ?

## 2022-03-19 ENCOUNTER — Other Ambulatory Visit: Payer: Self-pay | Admitting: Nurse Practitioner

## 2022-03-19 DIAGNOSIS — E782 Mixed hyperlipidemia: Secondary | ICD-10-CM

## 2024-07-09 ENCOUNTER — Emergency Department (HOSPITAL_COMMUNITY)

## 2024-07-09 ENCOUNTER — Emergency Department (HOSPITAL_COMMUNITY)
Admission: EM | Admit: 2024-07-09 | Discharge: 2024-07-10 | Disposition: A | Discharge status: Home / Self Care | Attending: Emergency Medicine | Admitting: Emergency Medicine

## 2024-07-09 ENCOUNTER — Other Ambulatory Visit: Payer: Self-pay

## 2024-07-09 ENCOUNTER — Encounter (HOSPITAL_COMMUNITY): Payer: Self-pay | Admitting: Pharmacy Technician

## 2024-07-09 DIAGNOSIS — R42 Dizziness and giddiness: Secondary | ICD-10-CM | POA: Diagnosis present

## 2024-07-09 DIAGNOSIS — H53143 Visual discomfort, bilateral: Secondary | ICD-10-CM | POA: Insufficient documentation

## 2024-07-09 LAB — I-STAT CHEM 8, ED
BUN: 19 mg/dL (ref 8–23)
Calcium, Ion: 1.18 mmol/L (ref 1.15–1.40)
Chloride: 102 mmol/L (ref 98–111)
Creatinine, Ser: 1.2 mg/dL (ref 0.61–1.24)
Glucose, Bld: 127 mg/dL — ABNORMAL HIGH (ref 70–99)
HCT: 44 % (ref 39.0–52.0)
Hemoglobin: 15 g/dL (ref 13.0–17.0)
Potassium: 3.8 mmol/L (ref 3.5–5.1)
Sodium: 140 mmol/L (ref 135–145)
TCO2: 27 mmol/L (ref 22–32)

## 2024-07-09 LAB — COMPREHENSIVE METABOLIC PANEL WITH GFR
ALT: 23 U/L (ref 0–44)
AST: 30 U/L (ref 15–41)
Albumin: 4.1 g/dL (ref 3.5–5.0)
Alkaline Phosphatase: 46 U/L (ref 38–126)
Anion gap: 12 (ref 5–15)
BUN: 18 mg/dL (ref 8–23)
CO2: 24 mmol/L (ref 22–32)
Calcium: 9 mg/dL (ref 8.9–10.3)
Chloride: 100 mmol/L (ref 98–111)
Creatinine, Ser: 1.19 mg/dL (ref 0.61–1.24)
GFR, Estimated: >60 mL/min (ref >60)
Glucose, Bld: 119 mg/dL — ABNORMAL HIGH (ref 70–99)
Potassium: 3.7 mmol/L (ref 3.5–5.1)
Sodium: 136 mmol/L (ref 135–145)
Total Bilirubin: 0.8 mg/dL (ref 0.0–1.2)
Total Protein: 7.2 g/dL (ref 6.5–8.1)

## 2024-07-09 LAB — CBG MONITORING, ED: Glucose-Capillary: 144 mg/dL — ABNORMAL HIGH (ref 70–99)

## 2024-07-09 LAB — DIFFERENTIAL
Abs Immature Granulocytes: 0.02 K/uL (ref 0.00–0.07)
Basophils Absolute: 0 K/uL (ref 0.0–0.1)
Basophils Relative: 0 %
Eosinophils Absolute: 0.3 K/uL (ref 0.0–0.5)
Eosinophils Relative: 5 %
Immature Granulocytes: 0 %
Lymphocytes Relative: 22 %
Lymphs Abs: 1.6 K/uL (ref 0.7–4.0)
Monocytes Absolute: 0.6 K/uL (ref 0.1–1.0)
Monocytes Relative: 8 %
Neutro Abs: 4.7 K/uL (ref 1.7–7.7)
Neutrophils Relative %: 65 %

## 2024-07-09 LAB — CBC
HCT: 43.9 % (ref 39.0–52.0)
Hemoglobin: 15 g/dL (ref 13.0–17.0)
MCH: 30.9 pg (ref 26.0–34.0)
MCHC: 34.2 g/dL (ref 30.0–36.0)
MCV: 90.5 fL (ref 80.0–100.0)
Platelets: 200 K/uL (ref 150–400)
RBC: 4.85 MIL/uL (ref 4.22–5.81)
RDW: 12.9 % (ref 11.5–15.5)
WBC: 7.2 K/uL (ref 4.0–10.5)
nRBC: 0 % (ref 0.0–0.2)

## 2024-07-09 LAB — ETHANOL: Alcohol, Ethyl (B): <15 mg/dL (ref <15)

## 2024-07-09 LAB — PROTIME-INR
INR: 1 (ref 0.8–1.2)
Prothrombin Time: 13.6 s (ref 11.4–15.2)

## 2024-07-09 LAB — APTT: aPTT: 32 s (ref 24–36)

## 2024-07-09 NOTE — ED Provider Triage Note (Signed)
 Emergency Medicine Provider Triage Evaluation Note  Nicolas Thompson , a 63 y.o. male  was evaluated in triage.  Pt complains of dizziness, blurry vision. Patient driving earlier today and had sudden onset dizziness and blurry vision and could not carry on so had to park car at approximately 7am. Dizziness and blurry vision has not improved since. Patient describes dizziness as light-headedness. Denies visual field deficit, facial droop, slurred speech, unilateral weakness, issues with balance and coordination. Denies blood thinning medication.   *Interpretor used for encounter [phone]*  Review of Systems  Positive: Dizziness, blurry vision Negative: Facial droop, slurred speech, unilateral weakness, visual field deficit  Physical Exam  BP (!) 150/73 (BP Location: Right Arm)   Pulse 67   Temp 98.5 F (36.9 C) (Oral)   Resp 16   SpO2 96%  Gen:   Awake, no distress   Resp:  Normal effort  MSK:   Moves extremities without difficulty  Other:  Vision grossly intact in triage, pupils equal round reactive to light, EOMs intact, equal grip strength bilaterally of upper extremities, no strength deficit of upper or lower extremities in triage  Medical Decision Making  Medically screening exam initiated at 7:23 PM.  Appropriate orders placed.  Nicolas Thompson was informed that the remainder of the evaluation will be completed by another provider, this initial triage assessment does not replace that evaluation, and the importance of remaining in the ED until their evaluation is complete.  Orders: CBC, CMP, i-STAT Chem-8, CBG monitoring, PT/INR, PTT, ethanol, differential, CT head without contrast, EKG   [name], [place] 07/09/24 1942

## 2024-07-09 NOTE — ED Triage Notes (Signed)
 Pt bib family with reports of sudden onset dizziness and blurred vision. Denies any chest pain/shob.

## 2024-07-10 ENCOUNTER — Emergency Department (HOSPITAL_COMMUNITY)

## 2024-07-10 NOTE — ED Provider Notes (Signed)
 [place] EMERGENC[facility] DEPARTMENT AT [facility] Provider Note   CSN: [id number] Arrival date & time: 07/09/24  8177     Patient presents with: No chief complaint on file.   Nicolas Thompson is a 63 y.o. male.   63 year old male with prior medical history as detailed below presents for evaluation.  Patient speaks Vietnamese.  Translator services utilized for interview.  Patient reports that he was in his normal state of health until yesterday.  He was driving.  He had acute onset dizziness and bilateral blurry vision.  Symptoms began yesterday morning around 7 AM.  He reports that his symptoms have improved during the lengthy wait time to be seen overnight.  He denies facial droop, slurred speech, weakness, issues with balance or coordination.  He denies prior history of stroke.  The history is provided by the patient and medical records.       Prior to Admission medications   Medication Sig Start Date End Date Taking? Authorizing Provider  cetirizine  (ZYRTEC  ALLERGY) 10 MG tablet Take 1 tablet (10 mg total) by mouth daily. 04/20/20   Alvia Corean CROME, FNP  diclofenac  (VOLTAREN ) 75 MG EC tablet TAKE 1 TABLET(75 MG) BY MOUTH TWICE DAILY 03/28/20   Kip Ade, NP  losartan  (COZAAR ) 50 MG tablet Take 1 tablet (50 mg total) by mouth daily. 03/15/21   Stuart Vernell Norris, PA-C  meclizine  (ANTIVERT ) 12.5 MG tablet Take 1 tablet (12.5 mg total) by mouth 3 (three) times daily as needed for dizziness. 01/23/20   Kip Ade, NP  methocarbamol  (ROBAXIN ) 500 MG tablet TAKE 1 TABLET(500 MG) BY MOUTH FOUR TIMES DAILY 03/28/20   Kip Ade, NP  pantoprazole  (PROTONIX ) 40 MG tablet Take 1 tablet (40 mg total) by mouth daily. Take daily 30 minutes before first meal of the day 12/16/21   Chandra Harlene LABOR, NP  rosuvastatin  (CRESTOR ) 10 MG tablet Take 1 tablet (10 mg total) by mouth daily. 12/16/21   Chandra Harlene LABOR, NP  triamcinolone  cream (KENALOG ) 0.1 % Apply 1 application  topically 2 (two) times daily. 04/20/20   Alvia Corean CROME, FNP    Allergies: Gadolinium derivatives    Review of Systems  All other systems reviewed and are negative.   Updated Vital Signs BP 120/78 (BP Location: Right Arm)   Pulse (!) 55   Temp 97.6 F (36.4 C)   Resp (!) 22   SpO2 98%   Physical Exam Vitals and nursing note reviewed.  Constitutional:      General: He is not in acute distress.    Appearance: Normal appearance. He is well-developed.  HENT:     Head: Normocephalic and atraumatic.  Eyes:     Conjunctiva/sclera: Conjunctivae normal.     Pupils: Pupils are equal, round, and reactive to light.  Cardiovascular:     Rate and Rhythm: Normal rate and regular rhythm.     Heart sounds: Normal heart sounds.  Pulmonary:     Effort: Pulmonary effort is normal. No respiratory distress.     Breath sounds: Normal breath sounds.  Abdominal:     General: There is no distension.     Palpations: Abdomen is soft.     Tenderness: There is no abdominal tenderness.  Musculoskeletal:        General: No deformity. Normal range of motion.     Cervical back: Normal range of motion and neck supple.  Skin:    General: Skin is warm and dry.  Neurological:     General:  No focal deficit present.     Mental Status: He is alert and oriented to person, place, and time.     (all labs ordered are listed, but only abnormal results are displayed) Labs Reviewed  COMPREHENSIVE METABOLIC PANEL WITH GFR - Abnormal; Notable for the following components:      Result Value   Glucose, Bld 119 (*)    All other components within normal limits  I-STAT CHEM 8, ED - Abnormal; Notable for the following components:   Glucose, Bld 127 (*)    All other components within normal limits  CBG MONITORING, ED - Abnormal; Notable for the following components:   Glucose-Capillary 144 (*)    All other components within normal limits  PROTIME-INR  APTT  CBC  DIFFERENTIAL  ETHANOL    EKG: EKG  Interpretation Date/Time:  Saturday July 09 2024 18:49:01 EDT Ventricular Rate:  67 PR Interval:  154 QRS Duration:  104 QT Interval:  406 QTC Calculation: 429 R Axis:   67  Text Interpretation: Normal sinus rhythm Nonspecific ST abnormality Simialor to prior on 12/16/21 Confirmed by Franklyn Gills [phone]) on 07/09/2024 6:58:47 PM  Radiology: CT HEAD WO CONTRAST Result Date: 07/09/2024 EXAM: CT Head Without Contrast 07/09/2024 08:06:12 PM TECHNIQUE: CT of the head was performed without the administration of intravenous contrast. Automated exposure control, iterative reconstruction, and/or weight-based adjustment of the mA/kV was utilized to reduce the radiation dose to as low as reasonably achievable. COMPARISON: None available. CLINICAL HISTORY: Sudden onset dizziness, blurry vision. FINDINGS: BRAIN AND VENTRICLES: Small periventricular white matter changes are present, likely reflecting a sequela of small vessel ischemia. 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 No acute hemorrhage. No evidence of acute infarct, other than the remote infarction as described. No hydrocephalus. No extra-axial collection. No mass effect or midline shift. ORBITS: No acute abnormality. SINUSES: No acute abnormality. SOFT TISSUES AND SKULL: No acute soft tissue abnormality. No skull fracture. IMPRESSION: 1. No acute intracranial abnormality. 2. Small periventricular white matter changes, likely sequela of small vessel ischemia. 3. 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 Electronically signed by: Dorethia Molt MD 07/09/2024 08:19 PM EDT RP Workstation: [id number]     Procedures   Medications Ordered in the ED - No data to display                                  Medical Decision Making Patient reports acute onset dizziness and bilateral blurry vision.  Symptoms have improved significantly since onset yesterday morning at 7 AM.  Evaluated by myself  at approximately 7:30 AM on Sunday.  Initial CT head does not reveal acute abnormality.  CT head suggests remote lacunar infarct.  Patient does not appear to be aware of prior history of stroke.  Obtained MRI does not show acute abnormality.  Other obtained labs are also without significant abnormality.  On reevaluation the patient is asymptomatic.  He feels much improved.  There is a language barrier, however, I think that his symptoms are most likely to have been secondary to peripheral vertigo.  He appears to have a history of this.  There is no evidence of acute stroke on workup and imaging.  He is now asymptomatic.  He desires discharge home.  He is advised, using translator services, to follow-up closely with his PCP, ophthalmology, and neurology.  Both he and his wife who is at bedside understand need for  close follow-up.  Strict return precautions given and understood.  Amount and/or Complexity of Data Reviewed Radiology: ordered.        Final diagnoses:  Dizziness    ED Discharge Orders     None          Laurice Maude BROCKS, MD 07/10/24 [phone]

## 2024-07-10 NOTE — ED Notes (Signed)
 Patient Alert and oriented to baseline. Stable and ambulatory to baseline. Patient verbalized understanding of the discharge instructions.  Patient belongings were taken by the patient.

## 2024-07-10 NOTE — Discharge Instructions (Signed)
 Return for any problem.  ?
